# Patient Record
Sex: Male | Born: 1980 | Race: White | Hispanic: No | Marital: Married | Smoking: Former smoker
Health system: Southern US, Community
[De-identification: ages and names within clinical notes are randomized; demographics above are authoritative.]

## PROBLEM LIST (undated history)

## (undated) DIAGNOSIS — M1990 Unspecified osteoarthritis, unspecified site: Secondary | ICD-10-CM

## (undated) DIAGNOSIS — R569 Unspecified convulsions: Secondary | ICD-10-CM

## (undated) HISTORY — PX: VIDEO ASSISTED THORACOSCOPY (VATS)/DECORTICATION: SHX6171

---

## 2016-11-28 ENCOUNTER — Inpatient Hospital Stay (HOSPITAL_COMMUNITY)
Admission: AD | Admit: 2016-11-28 | Discharge: 2016-12-06 | DRG: 165 | Disposition: A | Discharge status: Home / Self Care | Payer: BLUE CROSS/BLUE SHIELD | Source: Other Acute Inpatient Hospital | Attending: Internal Medicine | Admitting: Internal Medicine

## 2016-11-28 DIAGNOSIS — K5903 Drug induced constipation: Secondary | ICD-10-CM | POA: Diagnosis not present

## 2016-11-28 DIAGNOSIS — K5900 Constipation, unspecified: Secondary | ICD-10-CM | POA: Diagnosis present

## 2016-11-28 DIAGNOSIS — T402X5A Adverse effect of other opioids, initial encounter: Secondary | ICD-10-CM | POA: Diagnosis not present

## 2016-11-28 DIAGNOSIS — Z836 Family history of other diseases of the respiratory system: Secondary | ICD-10-CM | POA: Diagnosis not present

## 2016-11-28 DIAGNOSIS — J439 Emphysema, unspecified: Secondary | ICD-10-CM | POA: Diagnosis present

## 2016-11-28 DIAGNOSIS — J9383 Other pneumothorax: Principal | ICD-10-CM | POA: Diagnosis present

## 2016-11-28 DIAGNOSIS — J9382 Other air leak: Secondary | ICD-10-CM | POA: Diagnosis not present

## 2016-11-28 DIAGNOSIS — G40909 Epilepsy, unspecified, not intractable, without status epilepticus: Secondary | ICD-10-CM | POA: Diagnosis present

## 2016-11-28 DIAGNOSIS — R0781 Pleurodynia: Secondary | ICD-10-CM | POA: Diagnosis not present

## 2016-11-28 DIAGNOSIS — R0602 Shortness of breath: Secondary | ICD-10-CM

## 2016-11-28 DIAGNOSIS — F17290 Nicotine dependence, other tobacco product, uncomplicated: Secondary | ICD-10-CM | POA: Diagnosis present

## 2016-11-28 DIAGNOSIS — Z9889 Other specified postprocedural states: Secondary | ICD-10-CM

## 2016-11-28 DIAGNOSIS — Z8669 Personal history of other diseases of the nervous system and sense organs: Secondary | ICD-10-CM

## 2016-11-28 DIAGNOSIS — M1990 Unspecified osteoarthritis, unspecified site: Secondary | ICD-10-CM | POA: Diagnosis present

## 2016-11-28 DIAGNOSIS — J930 Spontaneous tension pneumothorax: Secondary | ICD-10-CM | POA: Diagnosis not present

## 2016-11-28 DIAGNOSIS — R739 Hyperglycemia, unspecified: Secondary | ICD-10-CM | POA: Diagnosis not present

## 2016-11-28 DIAGNOSIS — J939 Pneumothorax, unspecified: Secondary | ICD-10-CM

## 2016-11-28 DIAGNOSIS — J9311 Primary spontaneous pneumothorax: Secondary | ICD-10-CM | POA: Diagnosis not present

## 2016-11-28 DIAGNOSIS — F17201 Nicotine dependence, unspecified, in remission: Secondary | ICD-10-CM

## 2016-11-28 HISTORY — DX: Unspecified osteoarthritis, unspecified site: M19.90

## 2016-11-28 HISTORY — DX: Unspecified convulsions: R56.9

## 2016-11-28 MED ORDER — HYDROMORPHONE HCL 1 MG/ML IJ SOLN
0.5000 mg | Freq: Once | INTRAMUSCULAR | Status: AC
  Administered 2016-11-28: 0.5 mg via INTRAVENOUS
  Filled 2016-11-28: qty 1

## 2016-11-28 MED ORDER — HYDROMORPHONE HCL 1 MG/ML IJ SOLN
1.0000 mg | INTRAMUSCULAR | Status: DC | PRN
  Administered 2016-11-28 – 2016-12-02 (×19): 1 mg via INTRAVENOUS
  Administered 2016-12-03: 0.5 mg via INTRAVENOUS
  Administered 2016-12-03: 1 mg via INTRAVENOUS
  Administered 2016-12-03: 0.5 mg via INTRAVENOUS
  Filled 2016-11-28 (×21): qty 1

## 2016-11-28 MED ORDER — KETOROLAC TROMETHAMINE 30 MG/ML IJ SOLN
30.0000 mg | Freq: Four times a day (QID) | INTRAMUSCULAR | Status: AC
  Administered 2016-11-28 – 2016-11-29 (×3): 30 mg via INTRAVENOUS
  Filled 2016-11-28 (×3): qty 1

## 2016-11-28 NOTE — H&P (Signed)
History and Physical    Corey Wilson ZOX:[id number] DOB: 03/10/1981 DOA: 11/28/2016  PCP: No primary care provider on file.   Patient coming from: [facility]  Chief Complaint: Shortness of breath.  HPI: Corey Wilson is a 36 y.o. gentleman with a history of seizure and recurrent spontaneous pneumothorax (S/P prior VATS on the right at [facility] in 2016) who presented to the ED at [facility] for evaluation of shortness of breath and left-sided pleuritic chest pain.  No fever.  No syncope.  No cough.  No swelling.  Chest xray showed large left sided pneumothorax.  Chest tube was placed emergently.  Case was discussed with CT surgeon on call here at [facility] (Dr. Tyrone Sage).  Hospitalist asked to admit for CT surgery consultation.   Review of Systems: As per HPI otherwise 10 point review of systems negative.    Past Medical History:  Diagnosis Date  . Arthritis   . Seizure [facility]     Past Surgical History:  Procedure Laterality Date  . VIDEO ASSISTED THORACOSCOPY (VATS)/DECORTICATION Right      reports that he has quit smoking. He has never used smokeless tobacco. He reports that he uses drugs, including Marijuana. He reports that he does not drink alcohol. He is married.  He has three children.  He is a Curator.  NKDA  Family History  Problem Relation Age of Onset  . Other Mother   . Emphysema Father   His mother has also had spontaneous pneumonthorax.   Prior to Admission medications   Not on File    Physical Exam: Vitals:   11/28/16 2204  BP: 129/73  Pulse: 84  Resp: 18  Temp: 98.8 F (37.1 C)  TempSrc: Oral  SpO2: 99%  Weight: 71.8 kg (158 lb 6.4 oz)  Height: 6\' 1"  (1.854 m)      Constitutional: NAD, calm, comfortable Vitals:   11/28/16 2204  BP: 129/73  Pulse: 84  Resp: 18  Temp: 98.8 F (37.1 C)  TempSrc: Oral  SpO2: 99%  Weight: 71.8 kg (158 lb 6.4 oz)  Height: 6\' 1"  (1.854 m)   Eyes: PERRL, lids and conjunctivae normal ENMT: Mucous membranes  are moist. Posterior pharynx clear of any exudate or lesions. Normal dentition.  Neck: normal appearance, supple Respiratory: clear to auscultation bilaterally, no wheezing, Bibasilar crackles.  Normal respiratory effort. No accessory muscle use.  Chest tube in place to anterior left chest wall. Cardiovascular: Normal rate, regular rhythm, no murmurs / rubs / gallops. No extremity edema. 2+ pedal pulses. GI: abdomen is soft and compressible.  No distention.  No tenderness.  No masses palpated.  Bowel sounds are present. Musculoskeletal:  No joint deformity in upper and lower extremities. Good ROM, no contractures. Normal muscle tone.  Skin: no rashes, warm and dry Neurologic: CN 2-12 grossly intact. Sensation intact, Strength symmetric bilaterally, 5/5.  Psychiatric: Normal judgment and insight. Alert and oriented x 3. Normal mood.     Labs on Admission: CBC and CMP at [facility] were unremarkable.  Patient also had a negative flu screen.   Assessment/Plan Principal Problem:   Spontaneous pneumothorax      Spontaneous pneumothorax --S/P chest tube at outside hospital, leave at current suction for now --CT surgery should see in the AM --Repeat portable chest xray in the AM and prn for changes in respiratory status --Analgesics as needed --Supplemental oxygen as needed --incentive spirometer use   DVT prophylaxis: SCDs Code Status: FULL Family Communication: Patient alone at time of admission. Disposition Plan:  To be determined. Consults called: CT surgery Tyrone Sage(Gerhardt) agreed to consult when transfer was requested.  He will need to be notified of patient arrival in the AM. Admission status: Inpatient, telemetry.  I expect this patient will need inpatient services for greater than two midnights.   TIME SPENT: 50 minutes   Jerene Bearsarter,Keanna Tugwell Harrison MD [facility] Pager [phone]  If 7PM-7AM, please contact night-coverage [email or web address] Password [email or web address]  11/28/2016, 11:47  PM

## 2016-11-29 ENCOUNTER — Inpatient Hospital Stay (HOSPITAL_COMMUNITY): Payer: BLUE CROSS/BLUE SHIELD

## 2016-11-29 ENCOUNTER — Encounter (HOSPITAL_COMMUNITY): Payer: Self-pay | Admitting: Internal Medicine

## 2016-11-29 DIAGNOSIS — J9311 Primary spontaneous pneumothorax: Secondary | ICD-10-CM

## 2016-11-29 DIAGNOSIS — J9383 Other pneumothorax: Secondary | ICD-10-CM

## 2016-11-29 LAB — PROTIME-INR
INR: 1.1
Prothrombin Time: 14.3 seconds (ref 11.4–15.2)

## 2016-11-29 MED ORDER — OXYCODONE HCL 5 MG PO TABS
5.0000 mg | ORAL_TABLET | ORAL | Status: DC | PRN
  Administered 2016-11-29 – 2016-12-03 (×15): 5 mg via ORAL
  Filled 2016-11-29 (×16): qty 1

## 2016-11-29 MED ORDER — ACETAMINOPHEN 650 MG RE SUPP: 650.0000 mg | Freq: Four times a day (QID) | RECTAL | Status: DC | PRN

## 2016-11-29 MED ORDER — SODIUM CHLORIDE 0.9% FLUSH
3.0000 mL | Freq: Two times a day (BID) | INTRAVENOUS | Status: DC
  Administered 2016-11-29 – 2016-12-02 (×5): 3 mL via INTRAVENOUS

## 2016-11-29 MED ORDER — ACETAMINOPHEN 325 MG PO TABS
650.0000 mg | ORAL_TABLET | Freq: Four times a day (QID) | ORAL | Status: DC | PRN
Start: 2016-11-29 — End: 2016-12-03
  Administered 2016-11-29: 650 mg via ORAL
  Filled 2016-11-29: qty 2

## 2016-11-29 MED ORDER — ONDANSETRON HCL 4 MG/2ML IJ SOLN
4.0000 mg | Freq: Four times a day (QID) | INTRAMUSCULAR | Status: DC | PRN
  Administered 2016-11-29: 4 mg via INTRAVENOUS
  Filled 2016-11-29: qty 2

## 2016-11-29 MED ORDER — ONDANSETRON HCL 4 MG PO TABS: 4.0000 mg | ORAL_TABLET | Freq: Four times a day (QID) | ORAL | Status: DC | PRN

## 2016-11-29 MED ORDER — SODIUM CHLORIDE 0.9 % IV SOLN
INTRAVENOUS | Status: DC
  Administered 2016-11-29 – 2016-12-01 (×3): via INTRAVENOUS

## 2016-11-29 NOTE — Progress Notes (Signed)
[address]       [place] [id number]             [phone]        Regino SchultzeChauncy Robinette  Medical Record #[id number] Date of Birth: 08/16/1981  Referring: Emergency room at [facility] Primary Care: No primary care provider on file.  Chief Complaint:   Left spontaneous pneumothorax   History of Present Illness:     Patient is 36 year old male with previous history of pneumothorax on the right 2, 2 years ago he underwent [facility]. Patient previously was a smoker but has not smoked for several years. 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 [facility] night with significant shortness of breath and was found to have a large left pneumothorax. Chest tube was placed and the patient was transferred to [place].   Current Activity/ Functional Status: Patient is independent with mobility/ambulation, transfers, ADL's, IADL's.   Zubrod Score: At the time of surgery this patient's most appropriate activity status/level should be described as: [x]     0    Normal activity, no symptoms []     1    Restricted in physical strenuous activity but ambulatory, able to do out light work []     2    Ambulatory and capable of self care, unable to do work activities, up and about                 more than 50%  Of the time                            []     3    Only limited self care, in bed greater than 50% of waking hours []     4    Completely disabled, no self care, confined to bed or chair []     5    Moribund  Past Medical History:  Diagnosis Date  . Arthritis   . Seizure [facility](HCC)     Past Surgical History:  Procedure Laterality Date  . VIDEO ASSISTED THORACOSCOPY (VATS)/DECORTICATION Right     History  Smoking Status  . Former Smoker  Smokeless Tobacco  . Never Used    History  Alcohol Use No   Family history: Patient's mother has  a history of spontaneous pneumothorax when she was young  Social History   Social History  . Marital status: N/A    Spouse name: N/A  . Number of children: N/A  . Years of education: N/A   Occupational History  . 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 Smoking status: Former Games developermoker  . Smokeless tobacco: Never Used  . Alcohol use No  . Drug use: Yes    Types: Marijuana  . Sexual activity: Not on file   Other Topics Concern  . Not on file   Social History Narrative  . No narrative on file    Allergies not on file  Current Facility-Administered Medications  Medication Dose Route Frequency Provider Last Rate Last Dose  . 0.9 %  sodium chloride infusion   Intravenous Continuous Zannie CovePreetha Joseph, MD 75 mL/hr at 11/29/16 0848    . acetaminophen (TYLENOL) tablet 650 mg  650 mg Oral Q6H PRN Michael LitterNikki Carter, MD       Or  .  acetaminophen (TYLENOL) suppository 650 mg  650 mg Rectal Q6H PRN Michael Litter, MD      . HYDROmorphone (DILAUDID) injection 1 mg  1 mg Intravenous Q3H PRN Michael Litter, MD   1 mg at 11/29/16 0849  . ketorolac (TORADOL) 30 MG/ML injection 30 mg  30 mg Intravenous Q6H Michael Litter, MD   30 mg at 11/29/16 0511  . ondansetron (ZOFRAN) tablet 4 mg  4 mg Oral Q6H PRN Michael Litter, MD       Or  . ondansetron [facility]) injection 4 mg  4 mg Intravenous Q6H PRN Michael Litter, MD   4 mg at 11/29/16 0907  . oxyCODONE (Oxy IR/ROXICODONE) immediate release tablet 5 mg  5 mg Oral Q4H PRN Michael Litter, MD      . sodium chloride flush (NS) 0.9 % injection 3 mL  3 mL Intravenous Q12H Michael Litter, MD   3 mL at 11/29/16 0849    No prescriptions prior to admission.    Family History  Problem Relation Age of Onset  . Other Mother   . Emphysema Father      Review of Systems:      Cardiac Review of Systems: Y or N  Chest Pain [ y   ]  Resting SOB [   ] Exertional SOB  [  y]  Orthopnea [  ]   Pedal Edema [   ]    Palpitations [  ] Syncope  [  ]     Presyncope [   ]  General Review of Systems: [Y] = yes [  ]=no Constitional: recent weight change [  ]; anorexia [  ]; fatigue [  ]; nausea [  ]; night sweats [  ]; fever [  ]; or chills [  ]                                                               Dental: poor dentition[  ]; Last Dentist visit:   Eye : blurred vision [  ]; diplopia [   ]; vision changes [  ];  Amaurosis fugax[  ]; Resp: cough [  ];  wheezing[n  ];  hemoptysis[n  ]; shortness of breath[  ]; paroxysmal nocturnal dyspnea[  ]; dyspnea on exertion[  ]; or orthopnea[  ];  GI:  gallstones[  ], vomiting[  ];  dysphagia[  ]; melena[  ];  hematochezia [  ]; heartburn[  ];   Hx of  Colonoscopy[  ]; GU: kidney stones [  ]; hematuria[  ];   dysuria [  ];  nocturia[  ];  history of     obstruction [  ]; urinary frequency [n  ]             Skin: rash, swelling[  ];, hair loss[  ];  peripheral edema[  ];  or itching[  ]; Musculosketetal: myalgias[  ];  joint swelling[  ];  joint erythema[  ];  joint pain[  ];  back pain[  ];  Heme/Lymph: bruising[  ];  bleeding[  ];  anemia[  ];  Neuro: TIA[  ];  headaches[  ];  stroke[  ];  vertigo[  ];  seizures[  ];   paresthesias[  ];  difficulty walking[ n ];  Psych:depression[  ]; anxiety[  ];  Endocrine: diabetes[ n ];  thyroid dysfunction[ n ];  Immunizations: Flu [ n ]; Pneumococcal[  n];  Other:  Physical Exam: BP 98/63 (BP Location: Left Arm)   Pulse 68   Temp 98 F (36.7 C) (Oral)   Resp 18   Ht 6\' 1"  (1.854 m)   Wt 158 lb 6.4 oz (71.8 kg)   SpO2 99%   BMI 20.90 kg/m    General appearance: alert, cooperative and appears stated age Head: Normocephalic, without obvious abnormality, atraumatic Neck: no adenopathy, no carotid bruit, no JVD, supple, symmetrical, trachea midline and thyroid not enlarged, symmetric, no tenderness/mass/nodules Lymph nodes: Cervical, supraclavicular, and axillary nodes normal. Resp: clear to auscultation bilaterally Back: symmetric, no curvature. ROM  normal. No CVA tenderness. Cardio: regular rate and rhythm, S1, S2 normal, no murmur, click, rub or gallop GI: soft, non-tender; bowel sounds normal; no masses,  no organomegaly Extremities: extremities normal, atraumatic, no cyanosis or edema and Homans sign is negative, no sign of DVT Neurologic: Grossly normal  Diagnostic Studies & Laboratory data:     Recent Radiology Findings:   Dg Chest Port 1 View  Result Date: 11/29/2016 CLINICAL DATA:  Pneumothorax.  Chest tube EXAM: PORTABLE CHEST 1 VIEW COMPARISON:  11/28/2016 FINDINGS: Left chest tube in satisfactory position. Minimal left apical pneumothorax. No effusion. Lungs are well aerated without infiltrate. Surgical staples in the right lung apex. IMPRESSION: Left chest tube in good position. Minimal left apical pneumothorax. No pleural effusion. Electronically Signed   By: Marlan Palau M.D.   On: 11/29/2016 08:37     I have independently reviewed the above radiologic studies.  Recent Lab Findings: Lab Results  Component Value Date   INR 1.10 11/28/2016      Assessment / Plan:    Patient with first episode of left pneumothorax, previous history of pneumothorax 2 on the right. Currently with a well-placed chest tube without evidence of pneumothorax or air leak. I discussed with the patient treatment options, with no current airleak he may be able to be treated at this occurrence with tube thoracoscopy only, but he understands there is a risk for repeat pneumothorax as happened on the right.  Chest tube was placed to waterseal follow-up chest x-ray will be obtained tomorrow.   I  spent 30 minutes counseling the patient face to face and 50% or more the  time was spent in counseling and coordination of care. The total time spent in the appointment was 55 minutes.    Delight Ovens MD      [address] [place] [place] [address] Office [phone]   Beeper [phone]  11/29/2016 10:56 AM     Patient ID:  Bryan Goin, male   DOB: Feb 25, 1981, 36 y.o.   MRN: [id number]

## 2016-11-29 NOTE — Progress Notes (Signed)
Patient using electric cigarette in room.

## 2016-11-29 NOTE — Progress Notes (Signed)
PROGRESS NOTE    Corey Wilson  ZOX:[id number] DOB: Oct 31, 1981 DOA: 11/28/2016 PCP: No primary care provider on file.  Brief Narrative: Patient is 36 year old male with previous history of pneumothorax on the right 2, 2 years ago he underwent right video-assisted thoracoscopy at [facility]. Patient previously was a smoker but has not smoked for several years. 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 He presented to the [facility] emergency room last night with significant shortness of breath and was found to have a large left pneumothorax. Chest tube was placed and the patient was transferred to [place] for CVTS eval  Assessment & Plan:   Principal Problem:   Spontaneous pneumothorax -s/p chest tube yesterday -CXR today -Await CVTS input  DVT prophylaxis: SCDs Code Status: FULL Family Communication: none at bedside Disposition Plan:Home when PTX resolves  Consultants:   CVTS   Subjective: Feels ok, some pain at CT site Objective: Vitals:   11/28/16 2204 11/29/16 0508  BP: 129/73 98/63  Pulse: 84 68  Resp: 18 18  Temp: 98.8 F (37.1 C) 98 F (36.7 C)  TempSrc: Oral Oral  SpO2: 99% 99%  Weight: 71.8 kg (158 lb 6.4 oz)   Height: 6\' 1"  (1.854 m)     Intake/Output Summary (Last 24 hours) at 11/29/16 1150 Last data filed at 11/29/16 0849  Gross per 24 hour  Intake             1203 ml  Output              450 ml  Net              753 ml   Filed Weights   11/28/16 2204  Weight: 71.8 kg (158 lb 6.4 oz)    Examination:  General exam: Appears calm and comfortable  Respiratory system: Clear to auscultation. Respiratory effort normal. Chest tube noted Cardiovascular system: S1 & S2 heard, RRR. No JVD, murmurs, rubs, gallops or clicks. No pedal edema. Gastrointestinal system: Abdomen is nondistended, soft and nontender. Normal bowel sounds heard. Central nervous system: Alert and oriented. No focal neurological  deficits. Extremities: Symmetric 5 x 5 power. Skin: No rashes, lesions or ulcers Psychiatry: Judgement and insight appear normal. Mood & affect appropriate.     Data Reviewed: I have personally reviewed following labs and imaging studies  CBC: No results for input(s): WBC, NEUTROABS, HGB, HCT, MCV, PLT in the last 168 hours. Basic Metabolic Panel: No results for input(s): NA, K, CL, CO2, GLUCOSE, BUN, CREATININE, CALCIUM, MG, PHOS in the last 168 hours. GFR: CrCl cannot be calculated (No order found.). Liver Function Tests: No results for input(s): AST, ALT, ALKPHOS, BILITOT, PROT, ALBUMIN in the last 168 hours. No results for input(s): LIPASE, AMYLASE in the last 168 hours. No results for input(s): AMMONIA in the last 168 hours. Coagulation Profile:  Recent Labs Lab 11/28/16 2348  INR 1.10   Cardiac Enzymes: No results for input(s): CKTOTAL, CKMB, CKMBINDEX, TROPONINI in the last 168 hours. BNP (last 3 results) No results for input(s): PROBNP in the last 8760 hours. HbA1C: No results for input(s): HGBA1C in the last 72 hours. CBG: No results for input(s): GLUCAP in the last 168 hours. Lipid Profile: No results for input(s): CHOL, HDL, LDLCALC, TRIG, CHOLHDL, LDLDIRECT in the last 72 hours. Thyroid Function Tests: No results for input(s): TSH, T4TOTAL, FREET4, T3FREE, THYROIDAB in the last 72 hours. Anemia Panel: No results for input(s): VITAMINB12, FOLATE, FERRITIN, TIBC,  IRON, RETICCTPCT in the last 72 hours. Urine analysis: No results found for: COLORURINE, APPEARANCEUR, LABSPEC, PHURINE, GLUCOSEU, HGBUR, BILIRUBINUR, KETONESUR, PROTEINUR, UROBILINOGEN, NITRITE, LEUKOCYTESUR Sepsis Labs: @LABRCNTIP (procalcitonin:4,lacticidven:4)  )No results found for this or any previous visit (from the past 240 hour(s)).       Radiology Studies: Dg Chest Port 1 View  Result Date: 11/29/2016 CLINICAL DATA:  Pneumothorax.  Chest tube EXAM: PORTABLE CHEST 1 VIEW COMPARISON:   11/28/2016 FINDINGS: Left chest tube in satisfactory position. Minimal left apical pneumothorax. No effusion. Lungs are well aerated without infiltrate. Surgical staples in the right lung apex. IMPRESSION: Left chest tube in good position. Minimal left apical pneumothorax. No pleural effusion. Electronically Signed   By: Marlan Palauharles  Clark M.D.   On: 11/29/2016 08:37        Scheduled Meds: . ketorolac  30 mg Intravenous Q6H  . sodium chloride flush  3 mL Intravenous Q12H   Continuous Infusions: . sodium chloride 75 mL/hr at 11/29/16 0848     LOS: 1 day    Time spent: 35min    Zannie CovePreetha Corbet Hanley, MD [facility] Pager [phone]  If 7PM-7AM, please contact night-coverage [email or web address] Password [email or web address] 11/29/2016, 11:50 AM

## 2016-11-30 ENCOUNTER — Inpatient Hospital Stay (HOSPITAL_COMMUNITY): Payer: BLUE CROSS/BLUE SHIELD

## 2016-11-30 MED ORDER — KETOROLAC TROMETHAMINE 30 MG/ML IJ SOLN
30.0000 mg | Freq: Once | INTRAMUSCULAR | Status: AC
  Administered 2016-11-30: 30 mg via INTRAVENOUS
  Filled 2016-11-30: qty 1

## 2016-11-30 MED ORDER — POLYETHYLENE GLYCOL 3350 17 G PO PACK
17.0000 g | PACK | Freq: Every day | ORAL | Status: DC | PRN
  Administered 2016-11-30: 17 g via ORAL
  Filled 2016-11-30: qty 1

## 2016-11-30 NOTE — Progress Notes (Addendum)
      [address]       [place] [id number]             [phone]        Subjective:  Pain at chest tube site.  States medication isn't lasting long enough.  Objective: Vital signs in last 24 hours: Temp:  [98 F (36.7 C)-99.2 F (37.3 C)] 98.9 F (37.2 C) (01/29 0503) Pulse Rate:  [66-87] 87 (01/29 0503) Cardiac Rhythm: Normal sinus rhythm (01/29 0701) Resp:  [18] 18 (01/29 0503) BP: (109-131)/(58-76) 131/76 (01/29 0503) SpO2:  [100 %] 100 % (01/29 0503)  Intake/Output from previous day: 01/28 0701 - 01/29 0700 In: 1203 [P.O.:1200; I.V.:3] Out: 3855 [Urine:3825; Chest Tube:30]  General appearance: alert, cooperative and no distress Heart: regular rate and rhythm Lungs: clear to auscultation bilaterally Abdomen: soft, non-tender; bowel sounds normal; no masses,  no organomegaly Wound: clean and dry  Lab Results: No results for input(s): WBC, HGB, HCT, PLT in the last 72 hours. BMET: No results for input(s): NA, K, CL, CO2, GLUCOSE, BUN, CREATININE, CALCIUM in the last 72 hours.  PT/INR:  Recent Labs  11/28/16 2348  LABPROT 14.3  INR 1.10   ABG No results found for: PHART, HCO3, TCO2, ACIDBASEDEF, O2SAT CBG (last 3)  No results for input(s): GLUCAP in the last 72 hours.  Assessment/Plan:  1. Chest tube- +air leak with cough, CXR with 5% pneumothorax- leave on water seal today 2. Pain control- will give 30 mg IV toradol, continue Oxy, Dilaudid 3. Dispo- care per primary, try toradol for pain will check BMET to ensure creatinine is WNL, repeat CXR in AM   LOS: 2 days    BARRETT, ERIN 11/30/2016  No air leak currently , chest tube in, patient considering if he wants to have left VATS or d/c chest tube and follow  I have seen and examined Yehuda Savannahhauncy Fratus and agree with the above assessment  and plan.  Delight OvensEdward B Azarion Hove MD Beeper [phone] Office [phone] 11/30/2016 5:24 PM

## 2016-11-30 NOTE — Progress Notes (Signed)
PROGRESS NOTE    Corey Wilson  NFA:[id number] DOB: 10/16/1981 DOA: 11/28/2016 PCP: No primary care provider on file.  Brief Narrative: Patient is 36 year old male with previous history of pneumothorax on the right 2, 2 years ago he underwent right video-assisted thoracoscopy at [facility]. Patient previously was a smoker but has not smoked for several years. 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 He presented to the [facility] emergency room last night with significant shortness of breath and was found to have a large left pneumothorax. Chest tube was placed and the patient was transferred to [place] for CVTS eval  Assessment & Plan:   Principal Problem:   Spontaneous pneumothorax -s/p chest tube 1/27 at [facility] ER -CXR with 5% of less apical PTX -plan per CVTS  DVT prophylaxis: SCDs Code Status: FULL Family Communication: none at bedside Disposition Plan:Home when PTX resolves  Consultants:   CVTS   Subjective: Feels ok except for discomfort at chest tube site and pleuritic pain  Objective: Vitals:   11/29/16 0508 11/29/16 1414 11/29/16 2138 11/30/16 0503  BP: 98/63 (!) 109/58 120/69 131/76  Pulse: 68 66 86 87  Resp: 18 18 18 18   Temp: 98 F (36.7 C) 98 F (36.7 C) 99.2 F (37.3 C) 98.9 F (37.2 C)  TempSrc: Oral Oral Oral Oral  SpO2: 99% 100% 100% 100%  Weight:      Height:        Intake/Output Summary (Last 24 hours) at 11/30/16 1101 Last data filed at 11/30/16 0600  Gross per 24 hour  Intake              720 ml  Output             3555 ml  Net            -2835 ml   Filed Weights   11/28/16 2204  Weight: 71.8 kg (158 lb 6.4 oz)    Examination:  General exam: Appears calm and comfortable  Respiratory system: decreased BS on left Cardiovascular system: S1 & S2 heard, RRR. No JVD, murmurs Gastrointestinal system: Abdomen is nondistended, soft and nontender. Normal bowel sounds heard. Central nervous  system: Alert and oriented. No focal neurological deficits. Extremities: Symmetric 5 x 5 power. Skin: No rashes, lesions or ulcers Psychiatry: Judgement and insight appear normal. Mood & affect appropriate.     Data Reviewed: I have personally reviewed following labs and imaging studies  CBC: No results for input(s): WBC, NEUTROABS, HGB, HCT, MCV, PLT in the last 168 hours. Basic Metabolic Panel: No results for input(s): NA, K, CL, CO2, GLUCOSE, BUN, CREATININE, CALCIUM, MG, PHOS in the last 168 hours. GFR: CrCl cannot be calculated (No order found.). Liver Function Tests: No results for input(s): AST, ALT, ALKPHOS, BILITOT, PROT, ALBUMIN in the last 168 hours. No results for input(s): LIPASE, AMYLASE in the last 168 hours. No results for input(s): AMMONIA in the last 168 hours. Coagulation Profile:  Recent Labs Lab 11/28/16 2348  INR 1.10   Cardiac Enzymes: No results for input(s): CKTOTAL, CKMB, CKMBINDEX, TROPONINI in the last 168 hours. BNP (last 3 results) No results for input(s): PROBNP in the last 8760 hours. HbA1C: No results for input(s): HGBA1C in the last 72 hours. CBG: No results for input(s): GLUCAP in the last 168 hours. Lipid Profile: No results for input(s): CHOL, HDL, LDLCALC, TRIG, CHOLHDL, LDLDIRECT in the last 72 hours. Thyroid Function Tests: No results for input(s): TSH,  T4TOTAL, FREET4, T3FREE, THYROIDAB in the last 72 hours. Anemia Panel: No results for input(s): VITAMINB12, FOLATE, FERRITIN, TIBC, IRON, RETICCTPCT in the last 72 hours. Urine analysis: No results found for: COLORURINE, APPEARANCEUR, LABSPEC, PHURINE, GLUCOSEU, HGBUR, BILIRUBINUR, KETONESUR, PROTEINUR, UROBILINOGEN, NITRITE, LEUKOCYTESUR Sepsis Labs: @LABRCNTIP (procalcitonin:4,lacticidven:4)  )No results found for this or any previous visit (from the past 240 hour(s)).       Radiology Studies: Dg Chest 2 View  Result Date: 11/30/2016 CLINICAL DATA:  Follow-up left-sided  pneumothorax with chest tube treatment. EXAM: CHEST  2 VIEW COMPARISON:  Portable chest x-ray of November 29, 2016 FINDINGS: The left apical pneumothorax persists and amounts to 5% or less of the lung volume. The left-sided chest tube tip projects over the posterior interspace of the fourth and fifth left ribs. This is slightly lower than on the previous study. There is no mediastinal shift. There is no pleural effusion. The right lung is clear. The heart and pulmonary vascularity are normal. IMPRESSION: 5% or less left apical pneumothorax which appears stable. Slightly inferiorly positioned left chest tube today as compared to yesterday's study. Electronically Signed   By: David  SwazilandJordan M.D.   On: 11/30/2016 07:12   Dg Chest Port 1 View  Result Date: 11/29/2016 CLINICAL DATA:  Pneumothorax.  Chest tube EXAM: PORTABLE CHEST 1 VIEW COMPARISON:  11/28/2016 FINDINGS: Left chest tube in satisfactory position. Minimal left apical pneumothorax. No effusion. Lungs are well aerated without infiltrate. Surgical staples in the right lung apex. IMPRESSION: Left chest tube in good position. Minimal left apical pneumothorax. No pleural effusion. Electronically Signed   By: Marlan Palauharles  Clark M.D.   On: 11/29/2016 08:37        Scheduled Meds: . sodium chloride flush  3 mL Intravenous Q12H   Continuous Infusions: . sodium chloride 75 mL/hr at 11/29/16 2308     LOS: 2 days    Time spent: 35min    Zannie CovePreetha Esaias Cleavenger, MD [facility] Pager [phone]  If 7PM-7AM, please contact night-coverage [email or web address] Password [email or web address] 11/30/2016, 11:01 AM

## 2016-12-01 ENCOUNTER — Inpatient Hospital Stay (HOSPITAL_COMMUNITY): Payer: BLUE CROSS/BLUE SHIELD

## 2016-12-01 LAB — BASIC METABOLIC PANEL
Anion gap: 8 (ref 5–15)
BUN: 12 mg/dL (ref 6–20)
CO2: 28 mmol/L (ref 22–32)
Calcium: 9.1 mg/dL (ref 8.9–10.3)
Chloride: 105 mmol/L (ref 101–111)
Creatinine, Ser: 0.93 mg/dL (ref 0.61–1.24)
GFR calc Af Amer: >60 mL/min (ref >60)
GLUCOSE: 126 mg/dL — AB (ref 65–99)
POTASSIUM: 3.7 mmol/L (ref 3.5–5.1)
Sodium: 141 mmol/L (ref 135–145)

## 2016-12-01 MED ORDER — KETOROLAC TROMETHAMINE 30 MG/ML IJ SOLN
30.0000 mg | Freq: Once | INTRAMUSCULAR | Status: AC
  Administered 2016-12-01: 30 mg via INTRAVENOUS
  Filled 2016-12-01: qty 1

## 2016-12-01 MED ORDER — POLYETHYLENE GLYCOL 3350 17 G PO PACK
17.0000 g | PACK | Freq: Two times a day (BID) | ORAL | Status: DC
  Administered 2016-12-01 – 2016-12-02 (×3): 17 g via ORAL
  Filled 2016-12-01 (×4): qty 1

## 2016-12-01 MED ORDER — HYDROMORPHONE HCL 1 MG/ML IJ SOLN
1.0000 mg | Freq: Once | INTRAMUSCULAR | Status: AC
  Administered 2016-12-01: 1 mg via INTRAVENOUS
  Filled 2016-12-01: qty 1

## 2016-12-01 MED ORDER — MAGNESIUM HYDROXIDE 400 MG/5ML PO SUSP
30.0000 mL | Freq: Every day | ORAL | Status: DC | PRN
  Administered 2016-12-02: 30 mL via ORAL
  Filled 2016-12-01: qty 30

## 2016-12-01 MED ORDER — SENNOSIDES 8.8 MG/5ML PO SYRP
5.0000 mL | ORAL_SOLUTION | Freq: Two times a day (BID) | ORAL | Status: DC
  Administered 2016-12-01 – 2016-12-02 (×3): 5 mL via ORAL
  Filled 2016-12-01 (×5): qty 5

## 2016-12-01 NOTE — Progress Notes (Signed)
PROGRESS NOTE    Corey Wilson  ZOX:[id number] DOB: 11/10/1980 DOA: 11/28/2016 PCP: No primary care provider on file.  Brief Narrative: Patient is 36 year old male with previous history of pneumothorax on the right 2, 2 years ago he underwent right [facility]ac[facility]. Patient previously was a smoker but has not smoked for several years. 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 [facility] night with significant shortness of breath and was found to have a large left pneumothorax. Chest tube was placed and the patient was transferred to [place] for CVTS eval. Dr.Gerhardt following  Assessment & Plan:    Spontaneous pneumothorax -s/p chest [facility]/27 [facility] -CXR with 5% of less apical PTX, still with air leak -plan per CVTS   Constipation -added laxatives  DVT prophylaxis: SCDs Code Status: FULL Family Communication: none at bedside Disposition Plan:Home when PTX resolves  Consultants:   CVTS   Subjective: Feels ok, c/o constipation  Objective: Vitals:   11/30/16 2051 11/30/16 2213 12/01/16 0611 12/01/16 0829  BP: 124/86  (!) 94/45 135/78  Pulse: 99  60 83  Resp: 18  18 18   Temp: 99.1 F (37.3 C) 99.1 F (37.3 C) 98.1 F (36.7 C) 98 F (36.7 C)  TempSrc: Oral Oral Oral Oral  SpO2: 99%  97% 99%  Weight:      Height:        Intake/Output Summary (Last 24 hours) at 12/01/16 1122 Last data filed at 12/01/16 1053  Gross per 24 hour  Intake              345 ml  Output             1340 ml  Net             -995 ml   Filed Weights   11/28/16 2204  Weight: 71.8 kg (158 lb 6.4 oz)    Examination:  General exam: Appears calm and comfortable  Respiratory system: decreased BS on left Cardiovascular system: S1 & S2 heard, RRR. No JVD, murmurs Gastrointestinal system: Abdomen is nondistended, soft and nontender. Normal bowel sounds heard. Central  nervous system: Alert and oriented. No focal neurological deficits. Extremities: Symmetric 5 x 5 power. Skin: No rashes, lesions or ulcers Psychiatry: Judgement and insight appear normal. Mood & affect appropriate.     Data Reviewed: I have personally reviewed following labs and imaging studies  CBC: No results for input(s): WBC, NEUTROABS, HGB, HCT, MCV, PLT in the last 168 hours. Basic Metabolic Panel:  Recent Labs Lab 12/01/16 0227  NA 141  K 3.7  CL 105  CO2 28  GLUCOSE 126*  BUN 12  CREATININE 0.93  CALCIUM 9.1   GFR: Estimated Creatinine Clearance: 113.8 mL/min (by C-G formula based on SCr of 0.93 mg/dL). Liver Function Tests: No results for input(s): AST, ALT, ALKPHOS, BILITOT, PROT, ALBUMIN in the last 168 hours. No results for input(s): LIPASE, AMYLASE in the last 168 hours. No results for input(s): AMMONIA in the last 168 hours. Coagulation Profile:  Recent Labs Lab 11/28/16 2348  INR 1.10   Cardiac Enzymes: No results for input(s): CKTOTAL, CKMB, CKMBINDEX, TROPONINI in the last 168 hours. BNP (last 3 results) No results for input(s): PROBNP in the last 8760 hours. HbA1C: No results for input(s): HGBA1C in the last 72 hours. CBG: No results for input(s): GLUCAP in the last 168 hours. Lipid Profile: No results  for input(s): CHOL, HDL, LDLCALC, TRIG, CHOLHDL, LDLDIRECT in the last 72 hours. Thyroid Function Tests: No results for input(s): TSH, T4TOTAL, FREET4, T3FREE, THYROIDAB in the last 72 hours. Anemia Panel: No results for input(s): VITAMINB12, FOLATE, FERRITIN, TIBC, IRON, RETICCTPCT in the last 72 hours. Urine analysis: No results found for: COLORURINE, APPEARANCEUR, LABSPEC, PHURINE, GLUCOSEU, HGBUR, BILIRUBINUR, KETONESUR, PROTEINUR, UROBILINOGEN, NITRITE, LEUKOCYTESUR Sepsis Labs: @LABRCNTIP (procalcitonin:4,lacticidven:4)  )No results found for this or any previous visit (from the past 240 hour(s)).       Radiology Studies: Dg Chest 2  View  Result Date: 11/30/2016 CLINICAL DATA:  Follow-up left-sided pneumothorax with chest tube treatment. EXAM: CHEST  2 VIEW COMPARISON:  Portable chest x-ray of November 29, 2016 FINDINGS: The left apical pneumothorax persists and amounts to 5% or less of the lung volume. The left-sided chest tube tip projects over the posterior interspace of the fourth and fifth left ribs. This is slightly lower than on the previous study. There is no mediastinal shift. There is no pleural effusion. The right lung is clear. The heart and pulmonary vascularity are normal. IMPRESSION: 5% or less left apical pneumothorax which appears stable. Slightly inferiorly positioned left chest tube today as compared to yesterday's study. Electronically Signed   By: David  [place] M.D.   On: 11/30/2016 07:12   Dg Chest Port 1 View  Result Date: 12/01/2016 CLINICAL DATA:  Left-sided chest tube.  Pneumothorax. EXAM: PORTABLE CHEST 1 VIEW COMPARISON:  Yesterday FINDINGS: Trace left apical pneumothorax, 5% or less. Left chest wall emphysema is decreased. Chest tube in place with slightly different orientation of the apex. Clear lungs. Normal heart size. IMPRESSION: Stable trace left apical pneumothorax. Electronically Signed   By: Marnee Spring M.D.   On: 12/01/2016 08:42        Scheduled Meds: . polyethylene glycol  17 g Oral BID  . sennosides  5 mL Oral BID  . sodium chloride flush  3 mL Intravenous Q12H   Continuous Infusions:    LOS: 3 days    Time spent:    Zannie Cove, MD [facility] Pager [phone]  If 7PM-7AM, please contact night-coverage [email or web address] Password [email or web address] 12/01/2016, 11:22 AM

## 2016-12-01 NOTE — Care Management Note (Signed)
Case Management Note Donn PieriniKristi Denajah Farias RN, BSN Unit 2W-Case Manager [phone]  Patient Details  Name: Yehuda SavannahChauncy Dziuba MRN: [id number] Date of Birth: 08/20/1981  Subjective/Objective:  Pt admitted with spont. pntx- chest tube placed                  Action/Plan: PTA pt lived at home- anticipate return home may need VATs prior to discharge. CM will follow.    Expected Discharge Date:                  Expected Discharge Plan:  Home/Self Care  In-House Referral:     Discharge planning Services  CM Consult  Post Acute Care Choice:    Choice offered to:     DME Arranged:    DME Agency:     HH Arranged:    HH Agency:     Status of Service:  In process, will continue to follow  If discussed at Long Length of Stay Meetings, dates discussed:    Additional Comments:  Darrold SpanWebster, Breck [place] Hall, RN 12/01/2016, 10:39 AM

## 2016-12-01 NOTE — Progress Notes (Addendum)
[address]       [place] [id number]             [phone]         Subjective: Feels fine  Objective: Vital signs in last 24 hours: Temp:  [98 F (36.7 C)-99.1 F (37.3 C)] 98.1 F (36.7 C) (01/30 0611) Pulse Rate:  [60-99] 60 (01/30 0611) Cardiac Rhythm: Normal sinus rhythm (01/29 1900) Resp:  [18] 18 (01/30 0611) BP: (94-131)/(45-86) 94/45 (01/30 0611) SpO2:  [97 %-99 %] 97 % (01/30 0611)  Hemodynamic parameters for last 24 hours:    Intake/Output from previous day: 01/29 0701 - 01/30 0700 In: -  Out: 960 [Urine:950; Chest Tube:10] Intake/Output this shift: No intake/output data recorded.  General appearance: alert, cooperative and no distress Heart: regular rate and rhythm Lungs: clear to auscultation bilaterally Abdomen: benign  Lab Results: No results for input(s): WBC, HGB, HCT, PLT in the last 72 hours. BMET:  Recent Labs  12/01/16 0227  NA 141  K 3.7  CL 105  CO2 28  GLUCOSE 126*  BUN 12  CREATININE 0.93  CALCIUM 9.1    PT/INR:  Recent Labs  11/28/16 2348  LABPROT 14.3  INR 1.10   ABG No results found for: PHART, HCO3, TCO2, ACIDBASEDEF, O2SAT CBG (last 3)  No results for input(s): GLUCAP in the last 72 hours.  Meds Scheduled Meds: . polyethylene glycol  17 g Oral BID  . sennosides  5 mL Oral BID  . sodium chloride flush  3 mL Intravenous Q12H   Continuous Infusions: PRN Meds:.acetaminophen **OR** acetaminophen, HYDROmorphone (DILAUDID) injection, ondansetron **OR** ondansetron (ZOFRAN) IV, oxyCODONE  Xrays Dg Chest 2 View  Result Date: 11/30/2016 CLINICAL DATA:  Follow-up left-sided pneumothorax with chest tube treatment. EXAM: CHEST  2 VIEW COMPARISON:  Portable chest x-ray of November 29, 2016 FINDINGS: The left apical pneumothorax persists and amounts to 5% or less of the lung volume. The left-sided chest tube tip projects over the posterior interspace of the fourth and fifth left ribs. This is slightly  lower than on the previous study. There is no mediastinal shift. There is no pleural effusion. The right lung is clear. The heart and pulmonary vascularity are normal. IMPRESSION: 5% or less left apical pneumothorax which appears stable. Slightly inferiorly positioned left chest tube today as compared to yesterday's study. Electronically Signed   By: David  Swazil[place]Jordan M.D.   On: 11/30/2016 07:12   Dg Chest Port 1 View  Result Date: 11/29/2016 CLINICAL DATA:  Pneumothorax.  Chest tube EXAM: PORTABLE CHEST 1 VIEW COMPARISON:  11/28/2016 FINDINGS: Left chest tube in satisfactory position. Minimal left apical pneumothorax. No effusion. Lungs are well aerated without infiltrate. Surgical staples in the right lung apex. IMPRESSION: Left chest tube in good position. Minimal left apical pneumothorax. No pleural effusion. Electronically Signed   By: Marlan Palauharles  Clark M.D.   On: 11/29/2016 08:37    Assessment/Plan:  1 CXR is stable but has + air leak, keep to H2O seal for now, may need VATS 2 c/o some constipation- will order MOM    LOS: 3 days    GOLD,WAYNE E 12/01/2016   Discussed with patient the fact today he has definite air leak with cough This defines the treatment course , to proceed with Left VATs and stapling blebs , plan Thursday  Am I have seen and examined Yehuda Savannahhauncy Stefan and agree with the above assessment  and plan.  Delight OvensEdward B Vivion Romano MD  Beeper [phone] Office [phone] 12/01/2016 8:00 PM

## 2016-12-02 DIAGNOSIS — R0602 Shortness of breath: Secondary | ICD-10-CM

## 2016-12-02 DIAGNOSIS — K5900 Constipation, unspecified: Secondary | ICD-10-CM

## 2016-12-02 DIAGNOSIS — K5903 Drug induced constipation: Secondary | ICD-10-CM

## 2016-12-02 DIAGNOSIS — Z8669 Personal history of other diseases of the nervous system and sense organs: Secondary | ICD-10-CM

## 2016-12-02 DIAGNOSIS — F17201 Nicotine dependence, unspecified, in remission: Secondary | ICD-10-CM

## 2016-12-02 DIAGNOSIS — J9311 Primary spontaneous pneumothorax: Secondary | ICD-10-CM

## 2016-12-02 DIAGNOSIS — J9383 Other pneumothorax: Principal | ICD-10-CM

## 2016-12-02 LAB — CBC
HCT: 48.8 % (ref 39.0–52.0)
HEMOGLOBIN: 16.8 g/dL (ref 13.0–17.0)
MCH: 29.7 pg (ref 26.0–34.0)
MCHC: 34.4 g/dL (ref 30.0–36.0)
MCV: 86.2 fL (ref 78.0–100.0)
PLATELETS: 242 10*3/uL (ref 150–400)
RBC: 5.66 MIL/uL (ref 4.22–5.81)
RDW: 12 % (ref 11.5–15.5)
WBC: 8.8 10*3/uL (ref 4.0–10.5)

## 2016-12-02 LAB — TYPE AND SCREEN
ABO/RH(D): O NEG
ANTIBODY SCREEN: NEGATIVE

## 2016-12-02 LAB — CBC WITH DIFFERENTIAL/PLATELET
BASOS ABS: 0 10*3/uL (ref 0.0–0.1)
BASOS ABS: 0 10*3/uL (ref 0.0–0.1)
BASOS PCT: 0 %
BASOS PCT: 0 %
Eosinophils Absolute: 0.3 10*3/uL (ref 0.0–0.7)
Eosinophils Absolute: 0.3 10*3/uL (ref 0.0–0.7)
Eosinophils Relative: 4 %
Eosinophils Relative: 3 %
HEMATOCRIT: 48 % (ref 39.0–52.0)
HEMATOCRIT: 48.2 % (ref 39.0–52.0)
HEMOGLOBIN: 16.5 g/dL (ref 13.0–17.0)
Hemoglobin: 16.1 g/dL (ref 13.0–17.0)
Lymphocytes Relative: 23 %
Lymphocytes Relative: 14 %
Lymphs Abs: 1.8 10*3/uL (ref 0.7–4.0)
Lymphs Abs: 1.3 10*3/uL (ref 0.7–4.0)
MCH: 29.1 pg (ref 26.0–34.0)
MCH: 29.4 pg (ref 26.0–34.0)
MCHC: 33.5 g/dL (ref 30.0–36.0)
MCHC: 34.2 g/dL (ref 30.0–36.0)
MCV: 86.6 fL (ref 78.0–100.0)
MCV: 85.9 fL (ref 78.0–100.0)
MONO ABS: 0.8 10*3/uL (ref 0.1–1.0)
Monocytes Absolute: 1 10*3/uL (ref 0.1–1.0)
Monocytes Relative: 10 %
Monocytes Relative: 11 %
NEUTROS ABS: 4.9 10*3/uL (ref 1.7–7.7)
NEUTROS ABS: 6.7 10*3/uL (ref 1.7–7.7)
NEUTROS PCT: 72 %
Neutrophils Relative %: 63 %
PLATELETS: 214 10*3/uL (ref 150–400)
Platelets: 241 10*3/uL (ref 150–400)
RBC: 5.54 MIL/uL (ref 4.22–5.81)
RBC: 5.61 MIL/uL (ref 4.22–5.81)
RDW: 12.4 % (ref 11.5–15.5)
RDW: 12 % (ref 11.5–15.5)
WBC: 7.9 10*3/uL (ref 4.0–10.5)
WBC: 9.2 10*3/uL (ref 4.0–10.5)

## 2016-12-02 LAB — BLOOD GAS, ARTERIAL
ACID-BASE EXCESS: 4.2 mmol/L — AB (ref 0.0–2.0)
BICARBONATE: 28.4 mmol/L — AB (ref 20.0–28.0)
DRAWN BY: 257701
FIO2: 0.21
O2 SAT: 95.9 %
PCO2 ART: 44.2 mmHg (ref 32.0–48.0)
PH ART: 7.424 (ref 7.350–7.450)
PO2 ART: 79.6 mmHg — AB (ref 83.0–108.0)
Patient temperature: 98.6

## 2016-12-02 LAB — PROTIME-INR
INR: 1.08
PROTHROMBIN TIME: 14 s (ref 11.4–15.2)

## 2016-12-02 LAB — COMPREHENSIVE METABOLIC PANEL
ALBUMIN: 3.9 g/dL (ref 3.5–5.0)
ALK PHOS: 66 U/L (ref 38–126)
ALK PHOS: 63 U/L (ref 38–126)
ALT: 15 U/L — ABNORMAL LOW (ref 17–63)
ALT: 14 U/L — ABNORMAL LOW (ref 17–63)
AST: 15 U/L (ref 15–41)
AST: 15 U/L (ref 15–41)
Albumin: 4.2 g/dL (ref 3.5–5.0)
Anion gap: 12 (ref 5–15)
Anion gap: 9 (ref 5–15)
BILIRUBIN TOTAL: 0.8 mg/dL (ref 0.3–1.2)
BUN: 14 mg/dL (ref 6–20)
BUN: 14 mg/dL (ref 6–20)
CALCIUM: 10.1 mg/dL (ref 8.9–10.3)
CALCIUM: 10.2 mg/dL (ref 8.9–10.3)
CHLORIDE: 101 mmol/L (ref 101–111)
CO2: 31 mmol/L (ref 22–32)
CO2: 31 mmol/L (ref 22–32)
Chloride: 99 mmol/L — ABNORMAL LOW (ref 101–111)
Creatinine, Ser: 1.04 mg/dL (ref 0.61–1.24)
Creatinine, Ser: 1.12 mg/dL (ref 0.61–1.24)
GFR calc Af Amer: >60 mL/min (ref >60)
GFR calc Af Amer: >60 mL/min (ref >60)
GFR calc non Af Amer: >60 mL/min (ref >60)
GFR calc non Af Amer: >60 mL/min (ref >60)
GLUCOSE: 87 mg/dL (ref 65–99)
GLUCOSE: 101 mg/dL — AB (ref 65–99)
Potassium: 4.4 mmol/L (ref 3.5–5.1)
Potassium: 4.9 mmol/L (ref 3.5–5.1)
SODIUM: 141 mmol/L (ref 135–145)
Sodium: 142 mmol/L (ref 135–145)
TOTAL PROTEIN: 7.2 g/dL (ref 6.5–8.1)
Total Bilirubin: 0.7 mg/dL (ref 0.3–1.2)
Total Protein: 7.5 g/dL (ref 6.5–8.1)

## 2016-12-02 LAB — ABO/RH: ABO/RH(D): O NEG

## 2016-12-02 LAB — APTT: aPTT: 35 seconds (ref 24–36)

## 2016-12-02 MED ORDER — OXYCODONE HCL 5 MG PO TABS
5.0000 mg | ORAL_TABLET | Freq: Once | ORAL | Status: AC
  Administered 2016-12-02: 5 mg via ORAL
  Filled 2016-12-02: qty 1

## 2016-12-02 MED ORDER — KETOROLAC TROMETHAMINE 30 MG/ML IJ SOLN
30.0000 mg | Freq: Once | INTRAMUSCULAR | Status: AC
  Administered 2016-12-02: 30 mg via INTRAVENOUS
  Filled 2016-12-02: qty 1

## 2016-12-02 MED ORDER — DEXTROSE 5 % IV SOLN
1.5000 g | INTRAVENOUS | Status: AC
  Administered 2016-12-03: 1.5 g via INTRAVENOUS
  Filled 2016-12-02: qty 1.5

## 2016-12-02 NOTE — Anesthesia Preprocedure Evaluation (Addendum)
Anesthesia Evaluation  Patient identified by MRN, date of birth, ID band Patient awake    Reviewed: Allergy & Precautions, NPO status , Patient's Chart, lab work & pertinent test results  Airway Mallampati: II  TM Distance: >3 FB Neck ROM: Full    Dental no notable dental hx. (+) Teeth Intact   Pulmonary shortness of breath, former smoker,  Hx of spontaneous pneumothoraces, now with persistent air leak    + decreased breath sounds(-) wheezing (-) stridor     Cardiovascular negative cardio ROS Normal cardiovascular exam Rhythm:Regular Rate:Normal     Neuro/Psych Seizures -,  negative psych ROS   GI/Hepatic negative GI ROS, Neg liver ROS,   Endo/Other  negative endocrine ROS  Renal/GU negative Renal ROS     Musculoskeletal  (+) Arthritis ,   Abdominal   Peds  Hematology negative hematology ROS (+)   Anesthesia Other Findings   Reproductive/Obstetrics                           Anesthesia Physical Anesthesia Plan  ASA: II  Anesthesia Plan: General   Post-op Pain Management:    Induction: Intravenous  Airway Management Planned: Double Lumen EBT  Additional Equipment: Arterial line  Intra-op Plan: Utilization of Controlled Hypotension per surrgeon request  Post-operative Plan: Extubation in OR  Informed Consent: I have reviewed the patients History and Physical, chart, labs and discussed the procedure including the risks, benefits and alternatives for the proposed anesthesia with the patient or authorized representative who has indicated his/her understanding and acceptance.   Dental advisory given  Plan Discussed with: CRNA  Anesthesia Plan Comments:        Anesthesia Quick Evaluation

## 2016-12-02 NOTE — Progress Notes (Addendum)
       [address]       [place] [address]             [phone]        Procedure(s) (LRB): VIDEO ASSISTED THORACOSCOPY (Left) Subjective: No issues this morning. Feels okay.   Objective: Vital signs in last 24 hours: Temp:  [98 F (36.7 C)-98.5 F (36.9 C)] 98.5 F (36.9 C) (01/31 0515) Pulse Rate:  [79-95] 95 (01/31 0515) Cardiac Rhythm: Normal sinus rhythm (01/31 0701) Resp:  [16-18] 18 (01/31 0515) BP: (112-135)/(73-79) 112/73 (01/31 0515) SpO2:  [97 %-100 %] 100 % (01/31 0515)     Intake/Output from previous day: 01/30 0701 - 01/31 0700 In: 1047 [P.O.:942] Out: 1215 [Urine:1205; Chest Tube:10] Intake/Output this shift: No intake/output data recorded.  General appearance: alert, cooperative and no distress Heart: regular rate and rhythm, S1, S2 normal, no murmur, click, rub or gallop Lungs: clear to auscultation bilaterally Abdomen: soft, non-tender; bowel sounds normal; no masses,  no organomegaly Extremities: extremities normal, atraumatic, no cyanosis or edema Wound: clean and dry  Lab Results: No results for input(s): WBC, HGB, HCT, PLT in the last 72 hours. BMET:  Recent Labs  12/01/16 0227  NA 141  K 3.7  CL 105  CO2 28  GLUCOSE 126*  BUN 12  CREATININE 0.93  CALCIUM 9.1    PT/INR: No results for input(s): LABPROT, INR in the last 72 hours. ABG No results found for: PHART, HCO3, TCO2, ACIDBASEDEF, O2SAT CBG (last 3)  No results for input(s): GLUCAP in the last 72 hours.  Assessment/Plan: S/P Procedure(s) (LRB): VIDEO ASSISTED THORACOSCOPY (Left)  1. Chest xray is stable. Air leak present. Plan to do VATs with stapling of blebs on Thursday. On room air without issue.  2. HR and BP stable 3. Milk of magnesium for constipation      LOS: 4 days    Tessa N Conte 12/02/2016  Tonight patient has no air leak, but with the intermittent nature of air leak after 90 % ptx I have recommended to him proceeding with left  VATS as method to decrease risk of recurrent PTX. Patient agreeable. I have seen and examined Zakkery Kirwan and agree with the above assessment  and plan.  Jenesa Foresta B Erielle Gawronski MD Beeper [phone] Office [phone] 12/02/2016 6:57 PM   

## 2016-12-02 NOTE — Progress Notes (Signed)
Pt having C/O pain in chest tube site rating 10/10. Stated PRN diaudid & Oxy didnt relieve pain. Pt requesting something additional. [facility] notified. Orders placed. Will continue to monitor. Gregor HamsAlisha Jesi Jurgens, RN

## 2016-12-02 NOTE — Progress Notes (Signed)
PROGRESS NOTE    Corey Wilson  JXB:[id number] DOB: 12/19/1980 DOA: 11/28/2016 PCP: No primary care provider on file.   Brief Narrative:  Patient is 36 year old male with previous history of pneumothorax on the right 2; 2 years ago he underwent right [facility]ac[facility].Patient previously was a smoker but has not smoked for approximately 4 years no. 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 [facility]/2016 with significant shortness of breath and was found to have a large left pneumothorax. Chest tube was placed and the patient was transferred to [place] for CVTS eval. Dr.Gerhardt following and plans to take patient for VATS with stapling of blebs on 12/03/2016. Had no problems overnight and still complaining of constipation.  Assessment & Plan:   Principal Problem:   Spontaneous pneumothorax Active Problems:   Constipation   Tobacco abuse, in remission   Hx of seizure disorder  Spontaneous Left sided pneumothorax of Emphysematous Bleb -s/p chest tube 1/27 [facility] -CXR 12/01/2016 showed Trace left apical pneumothorax, 5% or less. Left chest wall emphysema is decreased. Chest tube in place with slightly different orientation of the apex. Clear lungs. Normal heart size -Still has Air leak but Saturating well on Room Air -Plan per CVTS -Analgesic with Acetaminophen 650 mg po q6hprn (mild Pain), Oxycodone 5 mg po q6hprn (moderate Pain) and Hydromorphone 1 mg q3hprn (severe pain) -Patient to uindergo VATs with stapling of blebs 12/03/2016 -Continue to follow and appreciate Dr. Sunday ShamsGerhadt's recc's  Constipation -Likely Opioid induced  -C/w Laxatives including Miralax 17 g po BID and Sennosides 5 mL po BID -Milk of Magnesium po Daily PRN   Hx of Tobacco Abuse/Recreational Drug Use -Has not used Tobacco in 4 years per patient -Still smokes marijuana occasionally  Hx of  Seizure -Stable. No Active Issues  DVT prophylaxis: SCD's Code Status: FULL CODE Family Communication: No family present at bedside Disposition Plan: Remain Inpatient for VATS in AM  Consultants:   CVTS Dr. Tyrone SageGerhardt  Procedures: VATS with Stapling of Blebs on Left side scheduled for 12/03/2016  Antimicrobials: None  Subjective: Seen and examined and was doing well. Had Air leak still. No N/V. Pain controlled with current regimen. Admits to being constipated and not having a BM in over a week. No other complaints. Tolerating diet well with no issues.   Objective: Vitals:   12/01/16 0829 12/01/16 1341 12/01/16 2059 12/02/16 0515  BP: 135/78 116/74 133/79 112/73  Pulse: 83 79 87 95  Resp: 18 16 18 18   Temp: 98 F (36.7 C) 98.2 F (36.8 C) 98 F (36.7 C) 98.5 F (36.9 C)  TempSrc: Oral Oral Oral Oral  SpO2: 99% 97% 100% 100%  Weight:      Height:        Intake/Output Summary (Last 24 hours) at 12/02/16 1045 Last data filed at 12/02/16 0900  Gross per 24 hour  Intake              732 ml  Output             1235 ml  Net             -503 ml   Filed Weights   11/28/16 2204  Weight: 71.8 kg (158 lb 6.4 oz)   Examination: Physical Exam:  Constitutional: WN/WD, NAD and appears calm and comfortable Eyes: Lids and conjunctivae normal, sclerae anicteric  ENMT: External Ears, Nose appear normal. Grossly  normal hearing.  Neck: Appears normal, supple, no cervical masses, normal ROM, no appreciable thyromegaly, no JVD Respiratory: Diminished to auscultation bilaterally, no wheezing, rales, rhonchi or crackles. Normal respiratory effort and patient is not tachypenic. No accessory muscle use. Left Chest tube in place connected to [facility] with waterseal.  Cardiovascular: RRR, no murmurs / rubs / gallops. S1 and S2 auscultated. No extremity edema.  Abdomen: Soft, non-tender, non-distended. No masses palpated. No appreciable hepatosplenomegaly. Bowel sounds positive x4. GU:  Deferred. Musculoskeletal: No clubbing / cyanosis of digits/nails. No joint deformity upper and lower extremities. Good ROM, no contractures Skin: No rashes, lesions, ulcers. No induration; Warm and dry. Multiple tattoos noted on Right Arm Neurologic: CN 2-12 grossly intact with no focal deficits. Sensation intact in all 4 Extremities. Romberg sign cerebellar reflexes not assessed.  Psychiatric: Normal judgment and insight. Alert and oriented x 3. Normal mood and appropriate affect.   Data Reviewed: I have personally reviewed following labs and imaging studies  CBC:  Recent Labs Lab 12/02/16 0842  WBC 7.9  NEUTROABS 4.9  HGB 16.1  HCT 48.0  MCV 86.6  PLT 214   Basic Metabolic Panel:  Recent Labs Lab 12/01/16 0227  NA 141  K 3.7  CL 105  CO2 28  GLUCOSE 126*  BUN 12  CREATININE 0.93  CALCIUM 9.1   GFR: Estimated Creatinine Clearance: 113.8 mL/min (by C-G formula based on SCr of 0.93 mg/dL). Liver Function Tests: No results for input(s): AST, ALT, ALKPHOS, BILITOT, PROT, ALBUMIN in the last 168 hours. No results for input(s): LIPASE, AMYLASE in the last 168 hours. No results for input(s): AMMONIA in the last 168 hours. Coagulation Profile:  Recent Labs Lab 11/28/16 2348  INR 1.10   Cardiac Enzymes: No results for input(s): CKTOTAL, CKMB, CKMBINDEX, TROPONINI in the last 168 hours. BNP (last 3 results) No results for input(s): PROBNP in the last 8760 hours. HbA1C: No results for input(s): HGBA1C in the last 72 hours. CBG: No results for input(s): GLUCAP in the last 168 hours. Lipid Profile: No results for input(s): CHOL, HDL, LDLCALC, TRIG, CHOLHDL, LDLDIRECT in the last 72 hours. Thyroid Function Tests: No results for input(s): TSH, T4TOTAL, FREET4, T3FREE, THYROIDAB in the last 72 hours. Anemia Panel: No results for input(s): VITAMINB12, FOLATE, FERRITIN, TIBC, IRON, RETICCTPCT in the last 72 hours. Sepsis Labs: No results for input(s): PROCALCITON,  LATICACIDVEN in the last 168 hours.  No results found for this or any previous visit (from the past 240 hour(s)).   Radiology Studies: Dg Chest Port 1 View  Result Date: 12/01/2016 CLINICAL DATA:  Left-sided chest tube.  Pneumothorax. EXAM: PORTABLE CHEST 1 VIEW COMPARISON:  Yesterday FINDINGS: Trace left apical pneumothorax, 5% or less. Left chest wall emphysema is decreased. Chest tube in place with slightly different orientation of the apex. Clear lungs. Normal heart size. IMPRESSION: Stable trace left apical pneumothorax. Electronically Signed   By: Marnee Spring M.D.   On: 12/01/2016 08:42   Scheduled Meds: . polyethylene glycol  17 g Oral BID  . sennosides  5 mL Oral BID  . sodium chloride flush  3 mL Intravenous Q12H   Continuous Infusions:   LOS: 4 days   Merlene Laughter, DO [facility] Pager [phone]  If 7PM-7AM, please contact night-coverage [email or web address] Password [email or web address] 12/02/2016, 10:45 AM

## 2016-12-03 ENCOUNTER — Inpatient Hospital Stay (HOSPITAL_COMMUNITY): Payer: BLUE CROSS/BLUE SHIELD

## 2016-12-03 ENCOUNTER — Encounter (HOSPITAL_COMMUNITY): Payer: Self-pay | Admitting: Anesthesiology

## 2016-12-03 ENCOUNTER — Inpatient Hospital Stay (HOSPITAL_COMMUNITY): Payer: BLUE CROSS/BLUE SHIELD | Admitting: Anesthesiology

## 2016-12-03 ENCOUNTER — Encounter (HOSPITAL_COMMUNITY)
Admission: AD | Disposition: A | Discharge status: Home / Self Care | Payer: Self-pay | Source: Other Acute Inpatient Hospital | Attending: Internal Medicine

## 2016-12-03 DIAGNOSIS — J930 Spontaneous tension pneumothorax: Secondary | ICD-10-CM

## 2016-12-03 HISTORY — PX: PLEURADESIS: SHX6030

## 2016-12-03 HISTORY — PX: STAPLING OF BLEBS: SHX6429

## 2016-12-03 HISTORY — PX: VIDEO ASSISTED THORACOSCOPY: SHX5073

## 2016-12-03 LAB — URINALYSIS, ROUTINE W REFLEX MICROSCOPIC
BACTERIA UA: NONE SEEN
Bilirubin Urine: NEGATIVE
Glucose, UA: NEGATIVE mg/dL
Ketones, ur: NEGATIVE mg/dL
Leukocytes, UA: NEGATIVE
Nitrite: NEGATIVE
PH: 6 (ref 5.0–8.0)
Protein, ur: NEGATIVE mg/dL
RBC / HPF: NONE SEEN RBC/hpf (ref 0–5)
SPECIFIC GRAVITY, URINE: 1.01 (ref 1.005–1.030)
Squamous Epithelial / LPF: NONE SEEN

## 2016-12-03 LAB — CBC WITH DIFFERENTIAL/PLATELET
Basophils Absolute: 0 10*3/uL (ref 0.0–0.1)
Basophils Relative: 0 %
Eosinophils Absolute: 0.3 10*3/uL (ref 0.0–0.7)
Eosinophils Relative: 4 %
HCT: 49.2 % (ref 39.0–52.0)
HEMOGLOBIN: 17.1 g/dL — AB (ref 13.0–17.0)
LYMPHS ABS: 2.2 10*3/uL (ref 0.7–4.0)
LYMPHS PCT: 28 %
MCH: 29.9 pg (ref 26.0–34.0)
MCHC: 34.8 g/dL (ref 30.0–36.0)
MCV: 86 fL (ref 78.0–100.0)
MONOS PCT: 9 %
Monocytes Absolute: 0.7 10*3/uL (ref 0.1–1.0)
NEUTROS PCT: 59 %
Neutro Abs: 4.8 10*3/uL (ref 1.7–7.7)
Platelets: 239 10*3/uL (ref 150–400)
RBC: 5.72 MIL/uL (ref 4.22–5.81)
RDW: 12 % (ref 11.5–15.5)
WBC: 8 10*3/uL (ref 4.0–10.5)

## 2016-12-03 LAB — GLUCOSE, CAPILLARY
GLUCOSE-CAPILLARY: 134 mg/dL — AB (ref 65–99)
Glucose-Capillary: 164 mg/dL — ABNORMAL HIGH (ref 65–99)

## 2016-12-03 LAB — COMPREHENSIVE METABOLIC PANEL
ALK PHOS: 69 U/L (ref 38–126)
ALT: 16 U/L — AB (ref 17–63)
AST: 18 U/L (ref 15–41)
Albumin: 4.4 g/dL (ref 3.5–5.0)
Anion gap: 9 (ref 5–15)
BILIRUBIN TOTAL: 0.7 mg/dL (ref 0.3–1.2)
BUN: 16 mg/dL (ref 6–20)
CO2: 29 mmol/L (ref 22–32)
CREATININE: 1.11 mg/dL (ref 0.61–1.24)
Calcium: 9.9 mg/dL (ref 8.9–10.3)
Chloride: 103 mmol/L (ref 101–111)
Glucose, Bld: 102 mg/dL — ABNORMAL HIGH (ref 65–99)
Potassium: 5 mmol/L (ref 3.5–5.1)
SODIUM: 141 mmol/L (ref 135–145)
TOTAL PROTEIN: 7.8 g/dL (ref 6.5–8.1)

## 2016-12-03 LAB — PHOSPHORUS: PHOSPHORUS: 4.1 mg/dL (ref 2.5–4.6)

## 2016-12-03 LAB — MAGNESIUM: Magnesium: 2.2 mg/dL (ref 1.7–2.4)

## 2016-12-03 SURGERY — VIDEO ASSISTED THORACOSCOPY
Anesthesia: General | Site: Chest | Laterality: Left

## 2016-12-03 MED ORDER — DIPHENHYDRAMINE HCL 50 MG/ML IJ SOLN: 12.5000 mg | Freq: Four times a day (QID) | INTRAMUSCULAR | Status: DC | PRN

## 2016-12-03 MED ORDER — FENTANYL CITRATE (PF) 100 MCG/2ML IJ SOLN
INTRAMUSCULAR | Status: AC
  Filled 2016-12-03: qty 2

## 2016-12-03 MED ORDER — DIPHENHYDRAMINE HCL 12.5 MG/5ML PO ELIX: 12.5000 mg | ORAL_SOLUTION | Freq: Four times a day (QID) | ORAL | Status: DC | PRN

## 2016-12-03 MED ORDER — SUGAMMADEX SODIUM 200 MG/2ML IV SOLN
INTRAVENOUS | Status: DC | PRN
  Administered 2016-12-03: 200 mg via INTRAVENOUS

## 2016-12-03 MED ORDER — CEFUROXIME SODIUM 1.5 G IJ SOLR
1.5000 g | Freq: Two times a day (BID) | INTRAMUSCULAR | Status: AC
  Administered 2016-12-03 – 2016-12-04 (×2): 1.5 g via INTRAVENOUS
  Filled 2016-12-03 (×2): qty 1.5

## 2016-12-03 MED ORDER — DEXAMETHASONE SODIUM PHOSPHATE 10 MG/ML IJ SOLN
INTRAMUSCULAR | Status: DC | PRN
  Administered 2016-12-03: 10 mg via INTRAVENOUS

## 2016-12-03 MED ORDER — HYDROMORPHONE HCL 1 MG/ML IJ SOLN
0.2500 mg | INTRAMUSCULAR | Status: DC | PRN
  Administered 2016-12-03 (×3): 0.5 mg via INTRAVENOUS

## 2016-12-03 MED ORDER — ONDANSETRON HCL 4 MG/2ML IJ SOLN: 4.0000 mg | Freq: Four times a day (QID) | INTRAMUSCULAR | Status: DC | PRN

## 2016-12-03 MED ORDER — FENTANYL CITRATE (PF) 100 MCG/2ML IJ SOLN
INTRAMUSCULAR | Status: DC | PRN
  Administered 2016-12-03 (×2): 100 ug via INTRAVENOUS

## 2016-12-03 MED ORDER — MIDAZOLAM HCL 2 MG/2ML IJ SOLN
INTRAMUSCULAR | Status: AC
  Filled 2016-12-03: qty 2

## 2016-12-03 MED ORDER — FENTANYL 40 MCG/ML IV SOLN
INTRAVENOUS | Status: AC
  Filled 2016-12-03: qty 25

## 2016-12-03 MED ORDER — PROPOFOL 10 MG/ML IV BOLUS
INTRAVENOUS | Status: DC | PRN
  Administered 2016-12-03: 140 mg via INTRAVENOUS

## 2016-12-03 MED ORDER — KETOROLAC TROMETHAMINE 30 MG/ML IJ SOLN
30.0000 mg | Freq: Once | INTRAMUSCULAR | Status: AC | PRN
  Administered 2016-12-03: 30 mg via INTRAVENOUS

## 2016-12-03 MED ORDER — KETAMINE HCL-SODIUM CHLORIDE 100-0.9 MG/10ML-% IV SOSY
PREFILLED_SYRINGE | INTRAVENOUS | Status: AC
  Filled 2016-12-03: qty 10

## 2016-12-03 MED ORDER — LIDOCAINE HCL (CARDIAC) 20 MG/ML IV SOLN
INTRAVENOUS | Status: DC | PRN
  Administered 2016-12-03: 100 mg via INTRAVENOUS

## 2016-12-03 MED ORDER — SODIUM CHLORIDE 0.9 % IV SOLN
30.0000 meq | Freq: Every day | INTRAVENOUS | Status: DC | PRN
  Filled 2016-12-03: qty 15

## 2016-12-03 MED ORDER — LEVALBUTEROL HCL 0.63 MG/3ML IN NEBU: 0.6300 mg | INHALATION_SOLUTION | Freq: Four times a day (QID) | RESPIRATORY_TRACT | Status: DC | PRN

## 2016-12-03 MED ORDER — HYDROMORPHONE HCL 1 MG/ML IJ SOLN
INTRAMUSCULAR | Status: AC
  Filled 2016-12-03: qty 1

## 2016-12-03 MED ORDER — BISACODYL 5 MG PO TBEC
10.0000 mg | DELAYED_RELEASE_TABLET | Freq: Every day | ORAL | Status: DC
  Administered 2016-12-03 – 2016-12-06 (×4): 10 mg via ORAL
  Filled 2016-12-03 (×4): qty 2

## 2016-12-03 MED ORDER — PHENYLEPHRINE HCL 10 MG/ML IJ SOLN
INTRAMUSCULAR | Status: DC | PRN
  Administered 2016-12-03: 80 ug via INTRAVENOUS

## 2016-12-03 MED ORDER — KETAMINE HCL 10 MG/ML IJ SOLN
INTRAMUSCULAR | Status: DC | PRN
  Administered 2016-12-03: 60 mg via INTRAVENOUS
  Administered 2016-12-03 (×2): 20 mg via INTRAVENOUS

## 2016-12-03 MED ORDER — DEXTROSE 5 % IV SOLN
INTRAVENOUS | Status: DC | PRN
  Administered 2016-12-03: 100 ug/min via INTRAVENOUS

## 2016-12-03 MED ORDER — TRAMADOL HCL 50 MG PO TABS
50.0000 mg | ORAL_TABLET | Freq: Four times a day (QID) | ORAL | Status: DC | PRN
  Administered 2016-12-04 – 2016-12-05 (×4): 100 mg via ORAL
  Filled 2016-12-03 (×4): qty 2

## 2016-12-03 MED ORDER — SODIUM CHLORIDE 0.9% FLUSH: 9.0000 mL | INTRAVENOUS | Status: DC | PRN

## 2016-12-03 MED ORDER — SENNOSIDES-DOCUSATE SODIUM 8.6-50 MG PO TABS
1.0000 | ORAL_TABLET | Freq: Every day | ORAL | Status: DC
  Administered 2016-12-03: 1 via ORAL
  Filled 2016-12-03 (×2): qty 1

## 2016-12-03 MED ORDER — HYDROMORPHONE HCL 1 MG/ML IJ SOLN
1.0000 mg | INTRAMUSCULAR | Status: DC
  Filled 2016-12-03: qty 1

## 2016-12-03 MED ORDER — ACETAMINOPHEN 500 MG PO TABS
1000.0000 mg | ORAL_TABLET | Freq: Four times a day (QID) | ORAL | Status: DC
  Administered 2016-12-03 – 2016-12-05 (×5): 1000 mg via ORAL
  Filled 2016-12-03 (×5): qty 2

## 2016-12-03 MED ORDER — ACETAMINOPHEN 160 MG/5ML PO SOLN: 1000.0000 mg | Freq: Four times a day (QID) | ORAL | Status: DC

## 2016-12-03 MED ORDER — OXYCODONE HCL 5 MG PO TABS
5.0000 mg | ORAL_TABLET | ORAL | Status: DC | PRN
  Administered 2016-12-03 (×2): 5 mg via ORAL
  Administered 2016-12-04 – 2016-12-05 (×6): 10 mg via ORAL
  Filled 2016-12-03 (×6): qty 2
  Filled 2016-12-03: qty 1
  Filled 2016-12-03: qty 2

## 2016-12-03 MED ORDER — PROMETHAZINE HCL 25 MG/ML IJ SOLN: 6.2500 mg | INTRAMUSCULAR | Status: DC | PRN

## 2016-12-03 MED ORDER — ACETAMINOPHEN 10 MG/ML IV SOLN
1000.0000 mg | Freq: Once | INTRAVENOUS | Status: AC | PRN
  Administered 2016-12-03: 1000 mg via INTRAVENOUS

## 2016-12-03 MED ORDER — ROCURONIUM BROMIDE 50 MG/5ML IV SOSY
PREFILLED_SYRINGE | INTRAVENOUS | Status: AC
  Filled 2016-12-03: qty 5

## 2016-12-03 MED ORDER — ACETAMINOPHEN 10 MG/ML IV SOLN
INTRAVENOUS | Status: AC
  Filled 2016-12-03: qty 100

## 2016-12-03 MED ORDER — LIDOCAINE 2% (20 MG/ML) 5 ML SYRINGE
INTRAMUSCULAR | Status: AC
  Filled 2016-12-03: qty 5

## 2016-12-03 MED ORDER — FENTANYL 40 MCG/ML IV SOLN
INTRAVENOUS | Status: AC
  Administered 2016-12-03: 285 ug via INTRAVENOUS
  Administered 2016-12-03: 15:00:00 via INTRAVENOUS
  Administered 2016-12-04: 135 ug via INTRAVENOUS
  Administered 2016-12-04: 1000 ug via INTRAVENOUS
  Administered 2016-12-04: 95 ug via INTRAVENOUS
  Administered 2016-12-05: 165 ug via INTRAVENOUS
  Administered 2016-12-05: 135 ug via INTRAVENOUS
  Filled 2016-12-03: qty 25

## 2016-12-03 MED ORDER — INSULIN ASPART 100 UNIT/ML ~~LOC~~ SOLN
0.0000 [IU] | SUBCUTANEOUS | Status: DC
  Administered 2016-12-04: 2 [IU] via SUBCUTANEOUS

## 2016-12-03 MED ORDER — PROPOFOL 10 MG/ML IV BOLUS
INTRAVENOUS | Status: AC
  Filled 2016-12-03: qty 20

## 2016-12-03 MED ORDER — LEVALBUTEROL HCL 0.63 MG/3ML IN NEBU
0.6300 mg | INHALATION_SOLUTION | Freq: Four times a day (QID) | RESPIRATORY_TRACT | Status: DC
  Administered 2016-12-03: 0.63 mg via RESPIRATORY_TRACT
  Filled 2016-12-03: qty 3

## 2016-12-03 MED ORDER — MEPERIDINE HCL 25 MG/ML IJ SOLN: 6.2500 mg | INTRAMUSCULAR | Status: DC | PRN

## 2016-12-03 MED ORDER — PHENYLEPHRINE 40 MCG/ML (10ML) SYRINGE FOR IV PUSH (FOR BLOOD PRESSURE SUPPORT)
PREFILLED_SYRINGE | INTRAVENOUS | Status: AC
  Filled 2016-12-03: qty 10

## 2016-12-03 MED ORDER — KETOROLAC TROMETHAMINE 30 MG/ML IJ SOLN
INTRAMUSCULAR | Status: AC
  Filled 2016-12-03: qty 1

## 2016-12-03 MED ORDER — ONDANSETRON HCL 4 MG/2ML IJ SOLN
INTRAMUSCULAR | Status: DC | PRN
  Administered 2016-12-03 (×2): 4 mg via INTRAVENOUS

## 2016-12-03 MED ORDER — DEXTROSE-NACL 5-0.45 % IV SOLN
INTRAVENOUS | Status: DC
  Administered 2016-12-03 – 2016-12-04 (×2): via INTRAVENOUS

## 2016-12-03 MED ORDER — ROCURONIUM BROMIDE 100 MG/10ML IV SOLN
INTRAVENOUS | Status: DC | PRN
  Administered 2016-12-03: 50 mg via INTRAVENOUS

## 2016-12-03 MED ORDER — LACTATED RINGERS IV SOLN
INTRAVENOUS | Status: DC | PRN
  Administered 2016-12-03 (×2): via INTRAVENOUS

## 2016-12-03 MED ORDER — NALOXONE HCL 0.4 MG/ML IJ SOLN: 0.4000 mg | INTRAMUSCULAR | Status: DC | PRN

## 2016-12-03 MED ORDER — FENTANYL CITRATE (PF) 100 MCG/2ML IJ SOLN
INTRAMUSCULAR | Status: AC
  Filled 2016-12-03: qty 4

## 2016-12-03 MED ORDER — SODIUM CHLORIDE 0.9 % IR SOLN
Status: DC | PRN
  Administered 2016-12-03: 3000 mL

## 2016-12-03 MED ORDER — MIDAZOLAM HCL 5 MG/5ML IJ SOLN
INTRAMUSCULAR | Status: DC | PRN
  Administered 2016-12-03: 2 mg via INTRAVENOUS

## 2016-12-03 MED ORDER — MIDAZOLAM HCL 2 MG/2ML IJ SOLN
2.0000 mg | Freq: Once | INTRAMUSCULAR | Status: AC
  Administered 2016-12-03: 2 mg via INTRAVENOUS

## 2016-12-03 SURGICAL SUPPLY — 75 items
APPLICATOR TIP COSEAL (VASCULAR PRODUCTS) IMPLANT
APPLICATOR TIP EXT COSEAL (VASCULAR PRODUCTS) IMPLANT
BLADE SURG 11 STRL SS (BLADE) ×2 IMPLANT
CANISTER SUCTION 2500CC (MISCELLANEOUS) ×2 IMPLANT
CATH KIT ON Q 5IN SLV (PAIN MANAGEMENT) IMPLANT
CATH THORACIC 28FR (CATHETERS) IMPLANT
CATH THORACIC 36FR (CATHETERS) IMPLANT
CATH THORACIC 36FR RT ANG (CATHETERS) IMPLANT
CLEANER TIP ELECTROSURG 2X2 (MISCELLANEOUS) ×4 IMPLANT
CLIP TI MEDIUM 6 (CLIP) ×2 IMPLANT
CONN ST 1/4X3/8  BEN (MISCELLANEOUS) ×1
CONN ST 1/4X3/8 BEN (MISCELLANEOUS) ×1 IMPLANT
CONT SPEC 4OZ CLIKSEAL STRL BL (MISCELLANEOUS) ×2 IMPLANT
DERMABOND ADVANCED (GAUZE/BANDAGES/DRESSINGS) ×1
DERMABOND ADVANCED .7 DNX12 (GAUZE/BANDAGES/DRESSINGS) ×1 IMPLANT
DRAIN CHANNEL 28F RND 3/8 FF (WOUND CARE) ×2 IMPLANT
DRAPE LAPAROSCOPIC ABDOMINAL (DRAPES) ×2 IMPLANT
DRAPE WARM FLUID 44X44 (DRAPE) IMPLANT
DRILL BIT 7/64X5 (BIT) IMPLANT
ELECT BLADE 4.0 EZ CLEAN MEGAD (MISCELLANEOUS) ×2
ELECT REM PT RETURN 9FT ADLT (ELECTROSURGICAL) ×2
ELECTRODE BLDE 4.0 EZ CLN MEGD (MISCELLANEOUS) ×1 IMPLANT
ELECTRODE REM PT RTRN 9FT ADLT (ELECTROSURGICAL) ×1 IMPLANT
GAUZE SPONGE 4X4 12PLY STRL (GAUZE/BANDAGES/DRESSINGS) ×2 IMPLANT
GLOVE BIO SURGEON STRL SZ 6.5 (GLOVE) ×4 IMPLANT
GLOVE BIOGEL PI IND STRL 6.5 (GLOVE) ×1 IMPLANT
GLOVE BIOGEL PI INDICATOR 6.5 (GLOVE) ×1
GLOVE SURG SS PI 6.5 STRL IVOR (GLOVE) ×2 IMPLANT
GOWN STRL REUS W/ TWL LRG LVL3 (GOWN DISPOSABLE) ×4 IMPLANT
GOWN STRL REUS W/TWL LRG LVL3 (GOWN DISPOSABLE) ×4
KIT BASIN OR (CUSTOM PROCEDURE TRAY) ×2 IMPLANT
KIT ROOM TURNOVER OR (KITS) ×2 IMPLANT
KIT SUCTION CATH 14FR (SUCTIONS) ×2 IMPLANT
NS IRRIG 1000ML POUR BTL (IV SOLUTION) ×4 IMPLANT
PACK CHEST (CUSTOM PROCEDURE TRAY) ×2 IMPLANT
PAD ARMBOARD 7.5X6 YLW CONV (MISCELLANEOUS) ×4 IMPLANT
PASSER SUT SWANSON 36MM LOOP (INSTRUMENTS) IMPLANT
RELOAD STAPLE 45 3.5 BLU ETS (ENDOMECHANICALS) ×2 IMPLANT
RELOAD STAPLE 45 GOLD REG/THCK (STAPLE) ×1 IMPLANT
RELOAD STAPLE TA45 3.5 REG BLU (ENDOMECHANICALS) ×4 IMPLANT
SEALANT PROGEL (MISCELLANEOUS) IMPLANT
SEALANT SURG COSEAL 4ML (VASCULAR PRODUCTS) IMPLANT
SEALANT SURG COSEAL 8ML (VASCULAR PRODUCTS) IMPLANT
SOLUTION ANTI FOG 6CC (MISCELLANEOUS) ×2 IMPLANT
STAPLE RELOAD 45MM GOLD (STAPLE) ×2 IMPLANT
STAPLER ENDO NO KNIFE (STAPLE) ×2 IMPLANT
SUT ETHILON 3 0 PS 1 (SUTURE) ×2 IMPLANT
SUT PROLENE 3 0 SH DA (SUTURE) IMPLANT
SUT PROLENE 4 0 RB 1 (SUTURE)
SUT PROLENE 4-0 RB1 .5 CRCL 36 (SUTURE) IMPLANT
SUT SILK  1 MH (SUTURE) ×2
SUT SILK 1 MH (SUTURE) ×2 IMPLANT
SUT SILK 2 0SH CR/8 30 (SUTURE) IMPLANT
SUT SILK 3 0SH CR/8 30 (SUTURE) IMPLANT
SUT VIC AB 1 CTX 18 (SUTURE) IMPLANT
SUT VIC AB 1 CTX 36 (SUTURE)
SUT VIC AB 1 CTX36XBRD ANBCTR (SUTURE) IMPLANT
SUT VIC AB 2-0 CTX 36 (SUTURE) IMPLANT
SUT VIC AB 2-0 UR6 27 (SUTURE) ×2 IMPLANT
SUT VIC AB 3-0 SH 8-18 (SUTURE) ×2 IMPLANT
SUT VIC AB 3-0 X1 27 (SUTURE) ×2 IMPLANT
SUT VICRYL 2 TP 1 (SUTURE) IMPLANT
SWAB COLLECTION DEVICE MRSA (MISCELLANEOUS) IMPLANT
SYSTEM SAHARA CHEST DRAIN ATS (WOUND CARE) ×2 IMPLANT
TAPE CLOTH 4X10 WHT NS (GAUZE/BANDAGES/DRESSINGS) ×2 IMPLANT
TAPE CLOTH SURG 6X10 WHT LF (GAUZE/BANDAGES/DRESSINGS) ×2 IMPLANT
TIP APPLICATOR SPRAY EXTEND 16 (VASCULAR PRODUCTS) IMPLANT
TOWEL OR 17X24 6PK STRL BLUE (TOWEL DISPOSABLE) IMPLANT
TOWEL OR 17X26 10 PK STRL BLUE (TOWEL DISPOSABLE) IMPLANT
TRAP SPECIMEN MUCOUS 40CC (MISCELLANEOUS) IMPLANT
TRAY FOLEY CATH SILVER 16FR LF (SET/KITS/TRAYS/PACK) IMPLANT
TROCAR BLADELESS 12MM (ENDOMECHANICALS) IMPLANT
TROCAR XCEL NON-BLD 11X100MML (ENDOMECHANICALS) ×2 IMPLANT
TUBE ANAEROBIC SPECIMEN COL (MISCELLANEOUS) IMPLANT
WATER STERILE IRR 1000ML POUR (IV SOLUTION) ×2 IMPLANT

## 2016-12-03 NOTE — Anesthesia Postprocedure Evaluation (Signed)
Anesthesia Post Note  Patient: Corey Wilson  Procedure(s) Performed: Procedure(s) (LRB): LEFT VIDEO ASSISTED THORACOSCOPY (Left) STAPLING OF APICAL BLEBS LEFT LUNG (Left) MECHANCAL PLEURADESIS LEFT LUNG (Left)  Patient location during evaluation: PACU Anesthesia Type: General Level of consciousness: sedated and patient cooperative Pain management: pain level controlled Vital Signs Assessment: post-procedure vital signs reviewed and stable Respiratory status: spontaneous breathing Cardiovascular status: stable Anesthetic complications: no       Last Vitals:  Vitals:   12/03/16 1547 12/03/16 1600  BP: 119/77   Pulse: 84 80  Resp: 11 10  Temp: 36.5 C     Last Pain:  Vitals:   12/03/16 1547  TempSrc:   PainSc: Asleep                 Lewie LoronJohn Yoltzin Ransom

## 2016-12-03 NOTE — Transfer of Care (Signed)
Immediate Anesthesia Transfer of Care Note  Patient: Corey Wilson  Procedure(s) Performed: Procedure(s): LEFT VIDEO ASSISTED THORACOSCOPY (Left) STAPLING OF APICAL BLEBS LEFT LUNG (Left) MECHANCAL PLEURADESIS LEFT LUNG (Left)  Patient Location: PACU  Anesthesia Type:General  Level of Consciousness: awake, alert  and oriented  Airway & Oxygen Therapy: Patient Spontanous Breathing and Patient connected to face mask oxygen  Post-op Assessment: Report given to RN, Post -op Vital signs reviewed and stable and Patient moving all extremities X 4  Post vital signs: Reviewed and stable  Last Vitals:  Vitals:   12/02/16 2055 12/03/16 0454  BP: 130/78 101/74  Pulse: 97 92  Resp: 18   Temp: 36.8 C 36.3 C    Last Pain:  Vitals:   12/03/16 0834  TempSrc:   PainSc: 5       Patients Stated Pain Goal: 0 (12/02/16 2015)  Complications: No apparent anesthesia complications

## 2016-12-03 NOTE — Interval H&P Note (Signed)
preop progress  Interval Note:  12/03/2016 6:47 AM  Corey Wilson  has presented today for surgery, with the diagnosis of AIR LEAK  The various methods of treatment have been discussed with the patient and family. After consideration of risks, benefits and other options for treatment, the patient has consented to  Procedure(s): VIDEO ASSISTED THORACOSCOPY (Left) as a surgical intervention .  The patient's history has been reviewed, patient examined, no change in status, stable for surgery.  I have reviewed the patient's chart and labs.  Questions were answered to the patient's satisfaction.   Lab Results  Component Value Date   WBC 8.0 12/03/2016   HGB 17.1 (H) 12/03/2016   HCT 49.2 12/03/2016   PLT 239 12/03/2016   GLUCOSE 102 (H) 12/03/2016   ALT 16 (L) 12/03/2016   AST 18 12/03/2016   NA 141 12/03/2016   K 5.0 12/03/2016   CL 103 12/03/2016   CREATININE 1.11 12/03/2016   BUN 16 12/03/2016   CO2 29 12/03/2016   INR 1.08 12/02/2016     Delight OvensEdward B Carrigan Delafuente

## 2016-12-03 NOTE — Brief Op Note (Addendum)
11/28/2016 - 12/03/2016  1:30 PM  PATIENT:  Corey Wilson  36 y.o. male  PRE-OPERATIVE DIAGNOSIS:  AIR LEAK  POST-OPERATIVE DIAGNOSIS:  AIR LEAK  PROCEDURE:  Procedure(s): LEFT VIDEO ASSISTED THORACOSCOPY (Left) STAPLING OF APICAL BLEBS LEFT LUNG (Left) MECHANCAL PLEURADESIS LEFT LUNG (Left)  SURGEON:  Surgeon(s) and Role:    * Delight OvensEdward B Abishai Viegas, MD - Primary  PHYSICIAN ASSISTANT:  Jari Favreessa Conte, PA-C   ANESTHESIA:   general  EBL:  Total I/O In: 1500 [I.V.:1500] Out: 0   BLOOD ADMINISTERED:none  DRAINS: left chest tube   LOCAL MEDICATIONS USED:  NONE  SPECIMEN:  No Specimen  DISPOSITION OF SPECIMEN:  N/A  COUNTS:  YES  TOURNIQUET:  * No tourniquets in log *  DICTATION: .Dragon Dictation  PLAN OF CARE: Admit to inpatient   PATIENT DISPOSITION:  ICU - extubated and stable.   Delay start of Pharmacological VTE agent (>24hrs) due to surgical blood loss or risk of bleeding: yes

## 2016-12-03 NOTE — Progress Notes (Signed)
PROGRESS NOTE    Corey Wilson  ZOX:[id number] DOB: Nov 14, 1980 DOA: 11/28/2016 PCP: No primary care provider on file.   Brief Narrative:  Patient is 36 year old male with previous history of pneumothorax on the right 2; 2 years ago he underwent right video-assisted thoracoscopy at [facility].Patient previously was a smoker but has not smoked for approximately 4 years no. 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 He presented to the [facility] emergency room 11/28/2016 with significant shortness of breath and was found to have a large left pneumothorax. Chest tube was placed and the patient was transferred to [place] for CVTS eval. Dr.Gerhardt following and plans to take patient for VATS with stapling of blebs on 12/03/2016. Had no problems and constipation is resolving. Complained of Air Leak again yesterday  Assessment & Plan:   Principal Problem:   Spontaneous pneumothorax Active Problems:   Constipation   Tobacco abuse, in remission   Hx of seizure disorder   Shortness of breath  Spontaneous Left sided pneumothorax of Emphysematous Bleb -s/p chest tube 1/27 at [facility] ER -CXR 12/01/2016 showed Trace left apical pneumothorax, 5% or less. Left chest wall emphysema is decreased. Chest tube in place with slightly different orientation of the apex. Clear lungs. Normal heart size -Still has Air leak but Saturating well on Room Air -Plan per CVTS -Had some chest wall pain last night; c/w  Analgesic with Acetaminophen 650 mg po q6hprn (mild Pain), Oxycodone 5 mg po q6hprn (moderate Pain) and Hydromorphone 1 mg q3hprn (severe pain) -ABG showed 7.424/44.2/79.6/28.4/95.9% -Patient to uindergo VATs with stapling of blebs today because of Air Leak -Continue to follow and appreciate Dr. Sunday Shams recc's  Constipation, improved -Likely Opioid induced  -C/w Laxatives including Miralax 17 g po BID and Sennosides 5 mL po BID -Milk of Magnesium po  Daily PRN   Hx of Tobacco Abuse/Recreational Drug Use -Has not used Tobacco in 4 years per patient -Still smokes marijuana Occasionally  Hx of Seizure -Stable. No Active Issues  DVT prophylaxis: SCD's Code Status: FULL CODE Family Communication: No family present at bedside Disposition Plan: Remain Inpatient for VATS in AM  Consultants:   CVTS Dr. Tyrone Sage  Procedures: VATS with Stapling of Blebs on Left side today  Antimicrobials: PREOPERATIVE Anti-infectives    Start     Dose/Rate Route Frequency Ordered Stop   12/03/16 0800  cefUROXime (ZINACEF) 1.5 g in dextrose 5 % 50 mL IVPB     1.5 g 100 mL/hr over 30 Minutes Intravenous To [facility] 12/02/16 1448 12/04/16 0800      Subjective: Overnight complained of chest tube site pain. Constipation has resolved. No other complaints or concerns. To go for VATS today  Objective: Vitals:   12/02/16 0515 12/02/16 1318 12/02/16 2055 12/03/16 0454  BP: 112/73 139/82 130/78 101/74  Pulse: 95 98 97 92  Resp: 18 20 18    Temp: 98.5 F (36.9 C) 98 F (36.7 C) 98.3 F (36.8 C) 97.3 F (36.3 C)  TempSrc: Oral Oral Oral Oral  SpO2: 100% 100% 99% 100%  Weight:      Height:        Intake/Output Summary (Last 24 hours) at 12/03/16 0727 Last data filed at 12/02/16 1830  Gross per 24 hour  Intake              720 ml  Output              800 ml  Net              -  80 ml   Filed Weights   11/28/16 2204  Weight: 71.8 kg (158 lb 6.4 oz)   Examination: Physical Exam:  Constitutional: WN/WD, NAD and appears calm and comfortable Eyes: Lids and conjunctivae normal, sclerae anicteric  ENMT: External Ears, Nose appear normal. Grossly normal hearing.  Neck: Appears normal, supple, no cervical masses, normal ROM, no appreciable thyromegaly, no JVD Respiratory: Diminished to auscultation bilaterally, no wheezing, rales, rhonchi or crackles. Normal respiratory effort and patient is not tachypenic. No accessory muscle use. Left  Chest tube in place connected to [facility] with waterseal.  Cardiovascular: RRR, no murmurs / rubs / gallops. S1 and S2 auscultated. No extremity edema.  Abdomen: Soft, non-tender, non-distended. No masses palpated. No appreciable hepatosplenomegaly. Bowel sounds positive x4. GU: Deferred. Musculoskeletal: No clubbing / cyanosis of digits/nails. No joint deformity upper and lower extremities. Good ROM, no contractures Skin: No rashes, lesions, ulcers. No induration; Warm and dry. Multiple tattoos noted on Right Arm Neurologic: CN 2-12 grossly intact with no focal deficits. Sensation intact in all 4 Extremities. Romberg sign cerebellar reflexes not assessed.  Psychiatric: Normal judgment and insight. Alert and oriented x 3. Normal mood and appropriate affect.   Data Reviewed: I have personally reviewed following labs and imaging studies  CBC:  Recent Labs Lab 12/02/16 0842 12/02/16 1421 12/02/16 1459 12/03/16 0222  WBC 7.9 9.2 8.8 8.0  NEUTROABS 4.9 6.7  --  4.8  HGB 16.1 16.5 16.8 17.1*  HCT 48.0 48.2 48.8 49.2  MCV 86.6 85.9 86.2 86.0  PLT 214 241 242 239   Basic Metabolic Panel:  Recent Labs Lab 12/01/16 0227 12/02/16 1421 12/02/16 1459 12/03/16 0222  NA 141 142 141 141  K 3.7 4.4 4.9 5.0  CL 105 99* 101 103  CO2 28 31 31 29   GLUCOSE 126* 87 101* 102*  BUN 12 14 14 16   CREATININE 0.93 1.04 1.12 1.11  CALCIUM 9.1 10.1 10.2 9.9  MG  --   --   --  2.2  PHOS  --   --   --  4.1   GFR: Estimated Creatinine Clearance: 95.4 mL/min (by C-G formula based on SCr of 1.11 mg/dL). Liver Function Tests:  Recent Labs Lab 12/02/16 1421 12/02/16 1459 12/03/16 0222  AST 15 15 18   ALT 15* 14* 16*  ALKPHOS 66 63 69  BILITOT 0.8 0.7 0.7  PROT 7.2 7.5 7.8  ALBUMIN 3.9 4.2 4.4   No results for input(s): LIPASE, AMYLASE in the last 168 hours. No results for input(s): AMMONIA in the last 168 hours. Coagulation Profile:  Recent Labs Lab 11/28/16 2348 12/02/16 1459  INR 1.10  1.08   Cardiac Enzymes: No results for input(s): CKTOTAL, CKMB, CKMBINDEX, TROPONINI in the last 168 hours. BNP (last 3 results) No results for input(s): PROBNP in the last 8760 hours. HbA1C: No results for input(s): HGBA1C in the last 72 hours. CBG: No results for input(s): GLUCAP in the last 168 hours. Lipid Profile: No results for input(s): CHOL, HDL, LDLCALC, TRIG, CHOLHDL, LDLDIRECT in the last 72 hours. Thyroid Function Tests: No results for input(s): TSH, T4TOTAL, FREET4, T3FREE, THYROIDAB in the last 72 hours. Anemia Panel: No results for input(s): VITAMINB12, FOLATE, FERRITIN, TIBC, IRON, RETICCTPCT in the last 72 hours. Sepsis Labs: No results for input(s): PROCALCITON, LATICACIDVEN in the last 168 hours.  No results found for this or any previous visit (from the past 240 hour(s)).   Radiology Studies: Dg Chest Port 1 View  Result Date: 12/01/2016 CLINICAL  DATA:  Left-sided chest tube.  Pneumothorax. EXAM: PORTABLE CHEST 1 VIEW COMPARISON:  Yesterday FINDINGS: Trace left apical pneumothorax, 5% or less. Left chest wall emphysema is decreased. Chest tube in place with slightly different orientation of the apex. Clear lungs. Normal heart size. IMPRESSION: Stable trace left apical pneumothorax. Electronically Signed   By: Marnee SpringJonathon  Watts M.D.   On: 12/01/2016 08:42   Scheduled Meds: . cefUROXime (ZINACEF)  IV  1.5 g Intravenous To SS-Surg  . polyethylene glycol  17 g Oral BID  . sennosides  5 mL Oral BID  . sodium chloride flush  3 mL Intravenous Q12H   Continuous Infusions:   LOS: 5 days   Merlene Laughtermair Latif Mialani Reicks, DO [facility] Pager [phone]  If 7PM-7AM, please contact night-coverage [email or web address] Password [email or web address] 12/03/2016, 7:27 AM

## 2016-12-03 NOTE — Progress Notes (Signed)
Patient ambulated in the PACU hallways, went around twice. Patient claims it felt good and tolerated ambulation well.

## 2016-12-03 NOTE — Anesthesia Procedure Notes (Signed)
Procedure Name: Intubation Date/Time: 12/03/2016 11:48 AM Performed by: Kyung Rudd Pre-anesthesia Checklist: Patient identified, Emergency Drugs available, Suction available and Patient being monitored Patient Re-evaluated:Patient Re-evaluated prior to inductionOxygen Delivery Method: Circle system utilized Preoxygenation: Pre-oxygenation with 100% oxygen Intubation Type: IV induction Ventilation: Mask ventilation without difficulty Laryngoscope Size: Mac and 4 Grade View: Grade I Endobronchial tube: Left, Double lumen EBT, EBT position confirmed by auscultation and EBT position confirmed by fiberoptic bronchoscope and 39 Fr Number of attempts: 1 Airway Equipment and Method: Stylet Placement Confirmation: ETT inserted through vocal cords under direct vision,  positive ETCO2 and breath sounds checked- equal and bilateral Tube secured with: Tape Dental Injury: Teeth and Oropharynx as per pre-operative assessment

## 2016-12-03 NOTE — H&P (View-Only) (Signed)
       [address]       [place] [id number]             [phone]        Procedure(s) (LRB): VIDEO ASSISTED THORACOSCOPY (Left) Subjective: No issues this morning. Feels okay.   Objective: Vital signs in last 24 hours: Temp:  [98 F (36.7 C)-98.5 F (36.9 C)] 98.5 F (36.9 C) (01/31 0515) Pulse Rate:  [79-95] 95 (01/31 0515) Cardiac Rhythm: Normal sinus rhythm (01/31 0701) Resp:  [16-18] 18 (01/31 0515) BP: (112-135)/(73-79) 112/73 (01/31 0515) SpO2:  [97 %-100 %] 100 % (01/31 0515)     Intake/Output from previous day: 01/30 0701 - 01/31 0700 In: 1047 [P.O.:942] Out: 1215 [Urine:1205; Chest Tube:10] Intake/Output this shift: No intake/output data recorded.  General appearance: alert, cooperative and no distress Heart: regular rate and rhythm, S1, S2 normal, no murmur, click, rub or gallop Lungs: clear to auscultation bilaterally Abdomen: soft, non-tender; bowel sounds normal; no masses,  no organomegaly Extremities: extremities normal, atraumatic, no cyanosis or edema Wound: clean and dry  Lab Results: No results for input(s): WBC, HGB, HCT, PLT in the last 72 hours. BMET:  Recent Labs  12/01/16 0227  NA 141  K 3.7  CL 105  CO2 28  GLUCOSE 126*  BUN 12  CREATININE 0.93  CALCIUM 9.1    PT/INR: No results for input(s): LABPROT, INR in the last 72 hours. ABG No results found for: PHART, HCO3, TCO2, ACIDBASEDEF, O2SAT CBG (last 3)  No results for input(s): GLUCAP in the last 72 hours.  Assessment/Plan: S/P Procedure(s) (LRB): VIDEO ASSISTED THORACOSCOPY (Left)  1. Chest xray is stable. Air leak present. Plan to do VATs with stapling of blebs on Thursday. On room air without issue.  2. HR and BP stable 3. Milk of magnesium for constipation      LOS: 4 days    Corey Wilson 12/02/2016  Tonight patient has no air leak, but with the intermittent nature of air leak after 90 % ptx I have recommended to him proceeding with left  VATS as method to decrease risk of recurrent PTX. Patient agreeable. I have seen and examined Corey Wilson and agree with the above assessment  and plan.  Corey OvensEdward B Gerhardt MD [facility] [phone] Office [phone] 12/02/2016 6:57 PM

## 2016-12-04 ENCOUNTER — Inpatient Hospital Stay (HOSPITAL_COMMUNITY): Payer: BLUE CROSS/BLUE SHIELD

## 2016-12-04 ENCOUNTER — Encounter (HOSPITAL_COMMUNITY): Payer: Self-pay | Admitting: Cardiothoracic Surgery

## 2016-12-04 LAB — BASIC METABOLIC PANEL
Anion gap: 10 (ref 5–15)
BUN: 15 mg/dL (ref 6–20)
CHLORIDE: 102 mmol/L (ref 101–111)
CO2: 26 mmol/L (ref 22–32)
CREATININE: 1.08 mg/dL (ref 0.61–1.24)
Calcium: 9.7 mg/dL (ref 8.9–10.3)
GFR calc non Af Amer: >60 mL/min (ref >60)
Glucose, Bld: 128 mg/dL — ABNORMAL HIGH (ref 65–99)
Potassium: 4.8 mmol/L (ref 3.5–5.1)
Sodium: 138 mmol/L (ref 135–145)

## 2016-12-04 LAB — CBC
HEMATOCRIT: 45 % (ref 39.0–52.0)
HEMOGLOBIN: 15.3 g/dL (ref 13.0–17.0)
MCH: 29.1 pg (ref 26.0–34.0)
MCHC: 34 g/dL (ref 30.0–36.0)
MCV: 85.7 fL (ref 78.0–100.0)
Platelets: 280 10*3/uL (ref 150–400)
RBC: 5.25 MIL/uL (ref 4.22–5.81)
RDW: 11.9 % (ref 11.5–15.5)
WBC: 12.2 10*3/uL — ABNORMAL HIGH (ref 4.0–10.5)

## 2016-12-04 LAB — MRSA PCR SCREENING: MRSA by PCR: NEGATIVE

## 2016-12-04 LAB — GLUCOSE, CAPILLARY
GLUCOSE-CAPILLARY: 206 mg/dL — AB (ref 65–99)
Glucose-Capillary: 99 mg/dL (ref 65–99)
Glucose-Capillary: 103 mg/dL — ABNORMAL HIGH (ref 65–99)

## 2016-12-04 MED ORDER — FAMOTIDINE 20 MG PO TABS
20.0000 mg | ORAL_TABLET | Freq: Two times a day (BID) | ORAL | Status: DC | PRN
Start: 2016-12-04 — End: 2016-12-06
  Administered 2016-12-04: 20 mg via ORAL
  Filled 2016-12-04: qty 1

## 2016-12-04 MED ORDER — KETOROLAC TROMETHAMINE 30 MG/ML IJ SOLN
30.0000 mg | Freq: Once | INTRAMUSCULAR | Status: AC
  Administered 2016-12-04: 30 mg via INTRAVENOUS
  Filled 2016-12-04: qty 1

## 2016-12-04 MED ORDER — KETOROLAC TROMETHAMINE 30 MG/ML IJ SOLN: 30.0000 mg | Freq: Four times a day (QID) | INTRAMUSCULAR | Status: DC | PRN

## 2016-12-04 MED ORDER — LUNG SURGERY BOOK
Freq: Once | Status: AC
  Administered 2016-12-04: 04:00:00
  Filled 2016-12-04: qty 1

## 2016-12-04 NOTE — Progress Notes (Signed)
12/04/2016 1700 Received pt to 2W22 from 2S.  Pt is A&O, no c/o voiced.  Tele monitor applied and CCMD notified.  Pt rates pain a 4 at this time, PCA Pump reviewed and button given to pt.  Chest tube site WNl, drainage system secure on floor.  Oriented to room, call light and bed.  Call bell in reach. Kathryne HitchAllen, Anayah Arvanitis C

## 2016-12-04 NOTE — Progress Notes (Signed)
Pt caught "vaping" in room. Pt aware that vaping not allowed in hospital and ICU.   Pt refused to send home vape pipe home with sister.  Pt ambulated to 2W22 (transfer) 600 feet without distress. [facility]

## 2016-12-04 NOTE — Progress Notes (Addendum)
      [address]       [place] [id number]             [phone]      1 Day Post-Op Procedure(s) (LRB): LEFT VIDEO ASSISTED THORACOSCOPY (Left) STAPLING OF APICAL BLEBS LEFT LUNG (Left) MECHANCAL PLEURADESIS LEFT LUNG (Left) Subjective: Pain is uncontrolled, however using PCA and getting Toradol.   Objective: Vital signs in last 24 hours: Temp:  [97.7 F (36.5 C)-99.2 F (37.3 C)] 99.2 F (37.3 C) (02/02 0410) Pulse Rate:  [78-134] 80 (02/02 0800) Cardiac Rhythm: Normal sinus rhythm (02/02 0750) Resp:  [8-30] 17 (02/02 0800) BP: (119-165)/(76-100) 132/84 (02/02 0600) SpO2:  [92 %-100 %] 100 % (02/02 0800) Weight:  [69.9 kg (154 lb)] 69.9 kg (154 lb) (02/02 0600)     Intake/Output from previous day: 02/01 0701 - 02/02 0700 In: 2935 [P.O.:200; I.V.:2685; IV Piggyback:50] Out: 1895 [Urine:1840; Blood:25; Chest Tube:30] Intake/Output this shift: Total I/O In: 100 [I.V.:100] Out: -   General appearance: alert, cooperative and no distress Heart: regular rate and rhythm, S1, S2 normal, no murmur, click, rub or gallop Lungs: clear to auscultation bilaterally Abdomen: soft, non-tender; bowel sounds normal; no masses,  no organomegaly Extremities: extremities normal, atraumatic, no cyanosis or edema Wound: clean and dry  Lab Results:  Recent Labs  12/03/16 0222 12/04/16 0351  WBC 8.0 12.2*  HGB 17.1* 15.3  HCT 49.2 45.0  PLT 239 280   BMET:  Recent Labs  12/03/16 0222 12/04/16 0351  NA 141 138  K 5.0 4.8  CL 103 102  CO2 29 26  GLUCOSE 102* 128*  BUN 16 15  CREATININE 1.11 1.08  CALCIUM 9.9 9.7    PT/INR:  Recent Labs  12/02/16 1459  LABPROT 14.0  INR 1.08   ABG    Component Value Date/Time   PHART 7.424 12/02/2016 1547   HCO3 28.4 (H) 12/02/2016 1547   O2SAT 95.9 12/02/2016 1547   CBG (last 3)   Recent Labs  12/03/16 2225 12/04/16 0002 12/04/16 0402  GLUCAP 164* 134* 99    Assessment/Plan: S/P Procedure(s)  (LRB): LEFT VIDEO ASSISTED THORACOSCOPY (Left) STAPLING OF APICAL BLEBS LEFT LUNG (Left) MECHANCAL PLEURADESIS LEFT LUNG (Left)  1. CV-NSR in the 80s with good blood pressure.  2. Pulm-on room air with good oxygen saturation. CXR stable with no residual pneumothorax. Chest tube 30cc/24 hours.  3. Renal- creatinine 1.08. Electrolytes okay. Good urine output.  4. Blood glucose level with moderate control.  5. H and H stable 6. Pain control- PCA with one time dose of Toradol 7. CIWA protocol? Showing withdrawal symptoms according to nursing.    Plan: Progressing well.  Working on pain control. Transfer to telemetry unit. Chest tube to waterseal.    LOS: 6 days    Sharlene Doryessa N Conte 12/04/2016  No air leak, ct to water seal, d/c tomorrow if xray ok and home 1-2 days  I have seen and examined Corey Wilson and agree with the above assessment  and plan.  Delight OvensEdward B Mateja Dier MD [facility] [phone] Office [phone] 12/04/2016 3:36 PM

## 2016-12-04 NOTE — Progress Notes (Signed)
PROGRESS NOTE    Yehuda SavannahChauncy Flinders  UJW:[id number] DOB: 04/20/1981 DOA: 11/28/2016 PCP: No primary care provider on file.   Brief Narrative:  Patient is 36 year old male with previous history of pneumothorax on the right 2; 2 years ago he [facility].Patient previously was a smoker but has not smoked for approximately 4 years no. 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 [facility]/28/2016 with significant shortness of breath and was found to have a large left pneumothorax. Chest tube was placed and the patient was transferred to [facility] for CVTS eval. Dr.Gerhardt following and patient taken for VATS with stapling of blebs on 12/03/2016 for Pneumothorax with Air Leak. Extubated and on Fentanyl PCA and being transferred to Telemetry.   Assessment & Plan:   Principal Problem:   Spontaneous pneumothorax Active Problems:   Constipation   Tobacco abuse, in remission   Hx of seizure disorder   Shortness of breath  Spontaneous Left sided pneumothorax of Emphysematous Bleb with Air Leak s/p VATS, Stapling of Apical Blebs and Mechanical Pleurodesis of Left Lung POD 1 -s/p chest tube 1/27 at [facility]; Chest Tube to water seal -CXR 12/01/2016 showed Trace left apical pneumothorax, 5% or less. Left chest wall emphysema is decreased. Chest tube in place with slightly different orientation of the apex. Clear lungs. Normal heart size -Repeat CXR for this AM 12/04/16 showed stable left apical chest tube. Surgical sutures overlie the bilateral lung apices. Stable cardiomediastinal silhouette with normal heart size. No pneumothorax. No pleural effusion. Lungs appear clear, with no acute consolidative airspace disease and no pulmonary edema. -Plan per CVTS; Currently being transferred from SICU to Telemetry -c/w Analgesia per CVTS with Acetaminophen 1000 mg po q6h, Fentanyl PCA, and  1x dose of 30 mg of Ketorolac, Oxycodone IR 5-10 mg po q4hprn and with Tramadol 50-100 mg po q6hprn -s/p POD 1 VATS, Stapling of Apical Blebs and Mechanical Pleurodesis of Left Lung -Bowel Regimen with 1 tablet of Senna-Doucsate po qHS and Bisacodyl 10 mg po Daily -IVF Rehydration with D5W 1/2NS at 100 mL/hr -Continue to follow and appreciate CVTS Dr. Sunday ShamsGerhadt's recc's; Possible D/C of Chest Tube tomorrow if X-Ray ok  Mild Leukocytosis -Likely Reactive to Above -WBC went from 8.0 -> 12.2 -Continue to Monitor for S/Sx of Infection -Repeat CBC in AM  Constipation, improved -Likely Opioid induced  -C/w Bowel Regimen with 1 tablet of Senna-Doucsate po qHS and Bisacodyl 10 mg po Daily -Continue to Monitor  Hx of Tobacco Abuse/Recreational Drug Use -Has not used Tobacco in 4 years per patient -Still smokes marijuana Occasionally; Counseling given  Hx of Seizure -Stable. No Active Issues  Hyperglycemia -Likely stress related  -Patient also on D5W 1/2NS -Has CBG Monitoring q4h and Novolog sq Scale  DVT prophylaxis: SCD's Code Status: FULL CODE Family Communication: No family present at bedside Disposition Plan: Transfer to Telemetry per CVTS  Consultants:   CVTS Dr. Tyrone SageGerhardt  Procedures: VATS with Stapling of Blebs on Left side today  Antimicrobials: PREOPERATIVE Anti-infectives    Start     Dose/Rate Route Frequency Ordered Stop   12/03/16 2330  cefUROXime (ZINACEF) 1.5 g in dextrose 5 % 50 mL IVPB     1.5 g 100 mL/hr over 30 Minutes Intravenous Every 12 hours 12/03/16 2154 12/04/16 2329   12/03/16 0800  cefUROXime (ZINACEF) 1.5 g in dextrose 5 % 50 mL IVPB     1.5  g 100 mL/hr over 30 Minutes Intravenous To [facility] 12/02/16 1448 12/03/16 1205     Subjective: Seen and examined and was in a lot of pain and stated Fentanyl PCA wasn't helping much. No N/V and states no air leak today. States last night was rough and did not sleep much. No other concerns or  complaints currently.   Objective: Vitals:   12/04/16 0420 12/04/16 0500 12/04/16 0600 12/04/16 0700  BP:  (!) 145/86 132/84   Pulse:  (!) 103 (!) 107 (!) 119  Resp: (!) 23 19 20 17   Temp:      TempSrc:      SpO2: 100% 100% 97% 99%  Weight:      Height:        Intake/Output Summary (Last 24 hours) at 12/04/16 0734 Last data filed at 12/04/16 0400  Gross per 24 hour  Intake             2150 ml  Output             1695 ml  Net              455 ml   Filed Weights   11/28/16 2204  Weight: 71.8 kg (158 lb 6.4 oz)   Examination: Physical Exam:  Constitutional: WN/WD, NAD and appears mildly uncomfortable Eyes: Lids and conjunctivae normal, sclerae anicteric  ENMT: External Ears, Nose appear normal. Grossly normal hearing.  Neck: Appears normal, supple, no cervical masses, normal ROM, no appreciable thyromegaly, no JVD Respiratory: Diminished to auscultation bilaterally, no wheezing, rales, rhonchi or crackles. Normal respiratory effort and patient is not tachypenic. No accessory muscle use. Left Chest tube in place connected to [facility] with waterseal and no Air Leak.  Cardiovascular: RRR, no murmurs / rubs / gallops. S1 and S2 auscultated. No extremity edema.  Abdomen: Soft, non-tender, non-distended. No masses palpated. No appreciable hepatosplenomegaly. Bowel sounds positive x4. GU: Deferred. Musculoskeletal: No clubbing / cyanosis of digits/nails. No joint deformity upper and lower extremities. Good ROM, no contractures Skin: No rashes, lesions, ulcers. No induration; Warm and dry. Multiple tattoos noted on Right Arm Neurologic: CN 2-12 grossly intact with no focal deficits. Sensation intact in all 4 Extremities. Romberg sign cerebellar reflexes not assessed.  Psychiatric: Normal judgment and insight. Alert and oriented x 3. Normal mood and appropriate affect.   Data Reviewed: I have personally reviewed following labs and imaging studies  CBC:  Recent Labs Lab 12/02/16 0842  12/02/16 1421 12/02/16 1459 12/03/16 0222 12/04/16 0351  WBC 7.9 9.2 8.8 8.0 12.2*  NEUTROABS 4.9 6.7  --  4.8  --   HGB 16.1 16.5 16.8 17.1* 15.3  HCT 48.0 48.2 48.8 49.2 45.0  MCV 86.6 85.9 86.2 86.0 85.7  PLT 214 241 242 239 280   Basic Metabolic Panel:  Recent Labs Lab 12/01/16 0227 12/02/16 1421 12/02/16 1459 12/03/16 0222 12/04/16 0351  NA 141 142 141 141 138  K 3.7 4.4 4.9 5.0 4.8  CL 105 99* 101 103 102  CO2 28 31 31 29 26   GLUCOSE 126* 87 101* 102* 128*  BUN 12 14 14 16 15   CREATININE 0.93 1.04 4.09 1.11 1.08  CALCIUM 9.1 10.1 10.2 9.9 9.7  MG  --   --   --  2.2  --   PHOS  --   --   --  4.1  --    GFR: Estimated Creatinine Clearance: 98 mL/min (by C-G formula based on SCr of 1.08 mg/dL). Liver  Function Tests:  Recent Labs Lab 12/02/16 1421 12/02/16 1459 12/03/16 0222  AST 15 15 18   ALT 15* 14* 16*  ALKPHOS 66 63 69  BILITOT 0.8 0.7 0.7  PROT 7.2 7.5 7.8  ALBUMIN 3.9 4.2 4.4   No results for input(s): LIPASE, AMYLASE in the last 168 hours. No results for input(s): AMMONIA in the last 168 hours. Coagulation Profile:  Recent Labs Lab 11/28/16 2348 12/02/16 1459  INR 1.10 1.08   Cardiac Enzymes: No results for input(s): CKTOTAL, CKMB, CKMBINDEX, TROPONINI in the last 168 hours. BNP (last 3 results) No results for input(s): PROBNP in the last 8760 hours. HbA1C: No results for input(s): HGBA1C in the last 72 hours. CBG:  Recent Labs Lab 12/03/16 2225 12/04/16 0002 12/04/16 0402  GLUCAP 164* 134* 99   Lipid Profile: No results for input(s): CHOL, HDL, LDLCALC, TRIG, CHOLHDL, LDLDIRECT in the last 72 hours. Thyroid Function Tests: No results for input(s): TSH, T4TOTAL, FREET4, T3FREE, THYROIDAB in the last 72 hours. Anemia Panel: No results for input(s): VITAMINB12, FOLATE, FERRITIN, TIBC, IRON, RETICCTPCT in the last 72 hours. Sepsis Labs: No results for input(s): PROCALCITON, LATICACIDVEN in the last 168 hours.  Recent Results  (from the past 240 hour(s))  MRSA PCR Screening     Status: None   Collection Time: 12/03/16  9:58 PM  Result Value Ref Range Status   MRSA by PCR NEGATIVE NEGATIVE Final    Comment:        The GeneXpert MRSA Assay (FDA approved for NASAL specimens only), is one component of a comprehensive MRSA colonization surveillance program. It is not intended to diagnose MRSA infection nor to guide or monitor treatment for MRSA infections.     Radiology Studies: Dg Chest Port 1 View  Result Date: 12/03/2016 CLINICAL DATA:  Post thoracotomy EXAM: PORTABLE CHEST 1 VIEW COMPARISON:  12/01/2016 FINDINGS: Cardiomediastinal silhouette is stable. Left chest tube with tip in left upper medial hemithorax. No infiltrate or pulmonary edema. Trace residual left apical pneumothorax with improvement from prior exam. IMPRESSION: Left chest tube with tip in left upper medial hemithorax. No infiltrate or pulmonary edema. Trace residual left apical pneumothorax with improvement from prior exam. Electronically Signed   By: Natasha Mead M.D.   On: 12/03/2016 15:16   Scheduled Meds: . acetaminophen  1,000 mg Oral Q6H   Or  . acetaminophen (TYLENOL) oral liquid 160 mg/5 mL  1,000 mg Oral Q6H  . bisacodyl  10 mg Oral Daily  . cefUROXime (ZINACEF)  IV  1.5 g Intravenous Q12H  . fentaNYL   Intravenous Q4H  . insulin aspart  0-24 Units Subcutaneous Q4H  . senna-docusate  1 tablet Oral QHS   Continuous Infusions: . dextrose 5 % and 0.45% NaCl 100 mL/hr at 12/04/16 0400    LOS: 6 days   Merlene Laughter, DO [facility] Pager [phone]  If 7PM-7AM, please contact night-coverage [email or web address] Password [facility] 12/04/2016, 7:34 AM

## 2016-12-04 NOTE — Discharge Instructions (Signed)
Pneumothorax °Introduction °A pneumothorax, commonly called a collapsed lung, is a condition in which air leaks from a lung and builds up in the space between the lung and the chest wall (pleural space). The air in a pneumothorax is trapped outside the lung and takes up space, preventing the lung from fully expanding. This is a condition that usually occurs suddenly. The buildup of air may be small or large. A small pneumothorax may go away on its own. When a pneumothorax is larger, it will often require medical treatment and hospitalization. °What are the causes? °A pneumothorax can sometimes happen quickly with no apparent cause. People with underlying lung problems, particularly COPD or emphysema, are at higher risk of pneumothorax. However, pneumothorax can happen quickly even in people with no prior known lung problems. Trauma, surgery, medical procedures, or injury to the chest wall can also cause a pneumothorax. °What are the signs or symptoms? °Sometimes a pneumothorax will have no symptoms. When symptoms are present, they can include: °· Chest pain. °· Shortness of breath. °· Increased rate of breathing. °· Bluish color to your lips or skin (cyanosis). °How is this diagnosed? °Pneumothorax is usually diagnosed by a chest X-ray or chest CT scan. Your health care provider will also take a medical history and perform a physical exam to determine why you may have a pneumothorax. °How is this treated? °A small pneumothorax may go away on its own without treatment. Extra oxygen can sometimes help a small pneumothorax go away more quickly. For a larger pneumothorax or a pneumothorax that is causing symptoms, a procedure is usually needed to drain the air. In some cases, the health care provider may drain the air using a needle. In other cases, a chest tube may be inserted into the pleural space. A chest tube is a small tube placed between the ribs and into the pleural space. This removes the extra air and allows  the lung to expand back to its normal size. A large pneumothorax will usually require a hospital stay. If there is ongoing air leakage into the pleural space, then the chest tube may need to remain in place for several days until the air leak has healed. In some cases, surgery may be needed. °Follow these instructions at home: °· Only take over-the-counter or prescription medicines as directed by your health care provider. °· If a cough or pain makes it difficult for you to sleep at night, try sleeping in a semi-upright position in a recliner or by using 2 or 3 pillows. °· Rest and limit activity as directed by your health care provider. °· If you had a chest tube and it was removed, ask your health care provider when it is okay to remove the dressing. Until your health care provider says you can remove the dressing, do not allow it to get wet. °· Do not smoke. Smoking is a risk factor for pneumothorax. °· Do not fly in an airplane or scuba dive until your health care provider says it is okay. °· Follow up with your health care provider as directed. °Get help right away if: °· You have increasing chest pain or shortness of breath. °· You have a cough that is not controlled with suppressants. °· You begin coughing up blood. °· You have pain that is getting worse or is not controlled with medicines. °· You cough up thick, discolored mucus (sputum) that is yellow to green in color. °· You have redness, increasing pain, or discharge at the site where a   chest tube had been in place (if your pneumothorax was treated with a chest tube). °· The site where your chest tube was located opens up. °· You feel air coming out of the site where the chest tube was placed. °· You have a fever or persistent symptoms for more than 2-3 days. °· You have a fever and your symptoms suddenly get worse. °This information is not intended to replace advice given to you by your health care provider. Make sure you discuss any questions you have  with your health care provider. °Document Released: 10/19/2005 Document Revised: 03/26/2016 Document Reviewed: 03/14/2014 °© 2017 [employer] ° °

## 2016-12-05 ENCOUNTER — Inpatient Hospital Stay (HOSPITAL_COMMUNITY): Payer: BLUE CROSS/BLUE SHIELD

## 2016-12-05 LAB — COMPREHENSIVE METABOLIC PANEL
ALBUMIN: 3.7 g/dL (ref 3.5–5.0)
ALK PHOS: 55 U/L (ref 38–126)
ALT: 14 U/L — AB (ref 17–63)
AST: 17 U/L (ref 15–41)
Anion gap: 5 (ref 5–15)
BUN: 11 mg/dL (ref 6–20)
CALCIUM: 9.4 mg/dL (ref 8.9–10.3)
CO2: 29 mmol/L (ref 22–32)
CREATININE: 1.12 mg/dL (ref 0.61–1.24)
Chloride: 106 mmol/L (ref 101–111)
GFR calc Af Amer: >60 mL/min (ref >60)
GFR calc non Af Amer: >60 mL/min (ref >60)
GLUCOSE: 95 mg/dL (ref 65–99)
Potassium: 4.5 mmol/L (ref 3.5–5.1)
SODIUM: 140 mmol/L (ref 135–145)
Total Bilirubin: 0.9 mg/dL (ref 0.3–1.2)
Total Protein: 6.7 g/dL (ref 6.5–8.1)

## 2016-12-05 LAB — CBC
HCT: 45.3 % (ref 39.0–52.0)
HEMOGLOBIN: 15 g/dL (ref 13.0–17.0)
MCH: 29 pg (ref 26.0–34.0)
MCHC: 33.1 g/dL (ref 30.0–36.0)
MCV: 87.5 fL (ref 78.0–100.0)
Platelets: 239 10*3/uL (ref 150–400)
RBC: 5.18 MIL/uL (ref 4.22–5.81)
RDW: 12.4 % (ref 11.5–15.5)
WBC: 10 10*3/uL (ref 4.0–10.5)

## 2016-12-05 LAB — PHOSPHORUS: Phosphorus: 4.2 mg/dL (ref 2.5–4.6)

## 2016-12-05 LAB — GLUCOSE, CAPILLARY: GLUCOSE-CAPILLARY: 100 mg/dL — AB (ref 65–99)

## 2016-12-05 LAB — MAGNESIUM: Magnesium: 1.9 mg/dL (ref 1.7–2.4)

## 2016-12-05 NOTE — Progress Notes (Signed)
PROGRESS NOTE    Corey Wilson  WUJ:[id number] DOB: [birth year] DOA: 11/28/2016 PCP: No primary care provider on file.   Brief Narrative:  Patient is 36 year old male with previous history of pneumothorax on the right 2; 2 years ago he underwent right video-assisted thoracoscopy at [facility] hospital.Patient previously was a smoker but has not smoked for approximately 4 years no. 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 He presented to the [facility] emergency room 11/28/2016 with significant shortness of breath and was found to have a large left pneumothorax. Chest tube was placed and the patient was transferred to [place] for CVTS eval. Dr.Gerhardt following and patient taken for VATS with stapling of blebs on 12/03/2016 for Pneumothorax with Air Leak. Extubated and on Fentanyl PCA. On medical floor now and possible D/C of Chest Tube.   Assessment & Plan:   Principal Problem:   Spontaneous pneumothorax Active Problems:   Constipation   Tobacco abuse, in remission   Hx of seizure disorder   Shortness of breath  Spontaneous Left sided pneumothorax of Emphysematous Bleb with Air Leak s/p VATS, Stapling of Apical Blebs and Mechanical Pleurodesis of Left Lung POD 2 -s/p chest tube 1/27 at [facility]; Chest Tube to water seal -CXR 12/01/2016 showed Trace left apical pneumothorax, 5% or less. Left chest wall emphysema is decreased. Chest tube in place with slightly different orientation of the apex. Clear lungs. Normal heart size -CXR 12/04/16 showed stable left apical chest tube. Surgical sutures overlie the bilateral lung apices. Stable cardiomediastinal silhouette with normal heart size. No pneumothorax. No pleural effusion. Lungs appear clear, with no acute consolidative airspace disease and no pulmonary edema. -c/w Analgesia per CVTS with Acetaminophen 1000 mg po q6h, Fentanyl PCA, and 1x dose of 30 mg of Ketorolac, Oxycodone IR 5-10 mg po q4hprn and  with Tramadol 50-100 mg po q6hprn -s/p POD 2 VATS, Stapling of Apical Blebs and Mechanical Pleurodesis of Left Lung -Bowel Regimen with 1 tablet of Senna-Doucsate po qHS and Bisacodyl 10 mg po Daily -IVF Rehydration with D5W 1/2NS at 100 mL/hr now Discontinued -Levalbuterol Nebulizer 0.63 mg Nebulized q6hprn -Continue to follow and appreciate CVTS Dr. Sunday Shams recc's;  -CXR on 12/05/16 showed Interval development of minimal left apical pneumothorax. Left-sided chest tube is unchanged in position. -Possible D/C of Chest Tube and removal today per CVTS  Mild Leukocytosis -Improved -Likely Reactive to Above -WBC went from 8.0 -> 12.2 -> 10.0 -Continue to Monitor for S/Sx of Infection -Repeat CBC in AM  Constipation, improved -Likely Opioid induced  -C/w Bowel Regimen with 1 tablet of Senna-Doucsate po qHS and Bisacodyl 10 mg po Daily -Continue to Monitor  Hx of Tobacco Abuse/Recreational Drug Use -Has not used Tobacco in 4 years per patient -Still smokes marijuana Occasionally; Counseling given -Patient was using a Vape in Room; Counseling given and told patient he was not allowed to vape in the hospital   Hx of Seizure -Stable. No Active Issues  Hyperglycemia -Improved -Likely stress related  -Patient also on D5W 1/2NS -Has CBG Monitoring q4h and Novolog sq Scale -CBG's have been running 100-206  DVT prophylaxis: SCD's Code Status: FULL CODE Family Communication: No family present at bedside Disposition Plan: Remain on Medical Floor; Possible D/C of Chest Tube  Consultants:   CVTS Dr. Tyrone Sage  Procedures: VATS with Stapling of Blebs on Left side 12/03/16  Antimicrobials: PREOPERATIVE Anti-infectives    Start     Dose/Rate Route Frequency Ordered Stop  12/03/16 2330  cefUROXime (ZINACEF) 1.5 g in dextrose 5 % 50 mL IVPB     1.5 g 100 mL/hr over 30 Minutes Intravenous Every 12 hours 12/03/16 2154 12/04/16 1005   12/03/16 0800  cefUROXime (ZINACEF) 1.5 g in dextrose 5  % 50 mL IVPB     1.5 g 100 mL/hr over 30 Minutes [facility] 12/02/16 1448 12/03/16 1205     Subjective: Patient states his night was a lot better. Pain better controlled. Was found using a Vape in the room. Advised and told to stop. No Air Leak. No other concerns or complaints and is ambulating well.   Objective: Vitals:   12/04/16 2100 12/05/16 0000 12/05/16 0400 12/05/16 0636  BP: (!) 142/91   (!) 145/85  Pulse: 90   94  Resp: 18 16 18    Temp: 97.9 F (36.6 C)   97.8 F (36.6 C)  TempSrc: Oral   Oral  SpO2: 99% 99% 100% 100%  Weight:      Height:        Intake/Output Summary (Last 24 hours) at 12/05/16 0740 Last data filed at 12/04/16 1500  Gross per 24 hour  Intake              800 ml  Output             1000 ml  Net             -200 ml   Filed Weights   11/28/16 2204 12/04/16 0600  Weight: 71.8 kg (158 lb 6.4 oz) 69.9 kg (154 lb)   Examination: Physical Exam:  Constitutional: WN/WD, NAD and appears calm and comfortable Eyes: Lids and conjunctivae normal, sclerae anicteric  ENMT: External Ears, Nose appear normal. Grossly normal hearing.  Neck: Appears normal, supple, no cervical masses, normal ROM, no appreciable thyromegaly, no JVD Respiratory: Diminished to auscultation bilaterally, no wheezing, rales, rhonchi or crackles. Normal respiratory effort and patient is not tachypenic. No accessory muscle use. Left Chest tube in place connected to [facility] with waterseal and no Air Leak.  Cardiovascular: RRR, no murmurs / rubs / gallops. S1 and S2 auscultated. No extremity edema.  Abdomen: Soft, non-tender, non-distended. No masses palpated. No appreciable hepatosplenomegaly. Bowel sounds positive x4. GU: Deferred. Musculoskeletal: No clubbing / cyanosis of digits/nails. No joint deformity upper and lower extremities. Good ROM, no contractures Skin: No rashes, lesions, ulcers. No induration; Warm and dry. Multiple tattoos noted on Right  Arm Neurologic: CN 2-12 grossly intact with no focal deficits. Sensation intact in all 4 Extremities. Romberg sign cerebellar reflexes not assessed.  Psychiatric: Normal judgment and insight. Alert and oriented x 3. Normal mood and appropriate affect.   Data Reviewed: I have personally reviewed following labs and imaging studies  CBC:  Recent Labs Lab 12/02/16 0842 12/02/16 1421 12/02/16 1459 12/03/16 0222 12/04/16 0351 12/05/16 0406  WBC 7.9 9.2 8.8 8.0 12.2* 10.0  NEUTROABS 4.9 6.7  --  4.8  --   --   HGB 16.1 16.5 16.8 17.1* 15.3 15.0  HCT 48.0 48.2 48.8 49.2 45.0 45.3  MCV 86.6 85.9 86.2 86.0 85.7 87.5  PLT 214 241 242 239 280 239   Basic Metabolic Panel:  Recent Labs Lab 12/02/16 1421 12/02/16 1459 12/03/16 0222 12/04/16 0351 12/05/16 0406  NA 142 141 141 138 140  K 4.4 4.9 5.0 4.8 4.5  CL 99* 101 103 102 106  CO2 31 31 29 26 29   GLUCOSE 87 101* 102* 128* 95  BUN 14 14 16 15 11   CREATININE 1.04 1.12 1.11 1.08 1.12  CALCIUM 10.1 10.2 9.9 9.7 9.4  MG  --   --  2.2  --  1.9  PHOS  --   --  4.1  --  4.2   GFR: Estimated Creatinine Clearance: 91.9 mL/min (by C-G formula based on SCr of 1.12 mg/dL). Liver Function Tests:  Recent Labs Lab 12/02/16 1421 12/02/16 1459 12/03/16 0222 12/05/16 0406  AST 15 15 18 17   ALT 15* 14* 16* 14*  ALKPHOS 66 63 69 55  BILITOT 0.8 0.7 0.7 0.9  PROT 7.2 7.5 7.8 6.7  ALBUMIN 3.9 4.2 4.4 3.7   No results for input(s): LIPASE, AMYLASE in the last 168 hours. No results for input(s): AMMONIA in the last 168 hours. Coagulation Profile:  Recent Labs Lab 11/28/16 2348 12/02/16 1459  INR 1.10 1.08   Cardiac Enzymes: No results for input(s): CKTOTAL, CKMB, CKMBINDEX, TROPONINI in the last 168 hours. BNP (last 3 results) No results for input(s): PROBNP in the last 8760 hours. HbA1C: No results for input(s): HGBA1C in the last 72 hours. CBG:  Recent Labs Lab 12/04/16 0002 12/04/16 0402 12/04/16 0923 12/04/16 1340  12/04/16 1629  GLUCAP 134* 99 206* 100* 103*   Lipid Profile: No results for input(s): CHOL, HDL, LDLCALC, TRIG, CHOLHDL, LDLDIRECT in the last 72 hours. Thyroid Function Tests: No results for input(s): TSH, T4TOTAL, FREET4, T3FREE, THYROIDAB in the last 72 hours. Anemia Panel: No results for input(s): VITAMINB12, FOLATE, FERRITIN, TIBC, IRON, RETICCTPCT in the last 72 hours. Sepsis Labs: No results for input(s): PROCALCITON, LATICACIDVEN in the last 168 hours.  Recent Results (from the past 240 hour(s))  MRSA PCR Screening     Status: None   Collection Time: 12/03/16  9:58 PM  Result Value Ref Range Status   MRSA by PCR NEGATIVE NEGATIVE Final    Comment:        The GeneXpert MRSA Assay (FDA approved for NASAL specimens only), is one component of a comprehensive MRSA colonization surveillance program. It is not intended to diagnose MRSA infection nor to guide or monitor treatment for MRSA infections.     Radiology Studies: Dg Chest Port 1 View  Result Date: 12/04/2016 CLINICAL DATA:  Chest tube, VATS, thoracotomy EXAM: PORTABLE CHEST 1 VIEW COMPARISON:  Chest radiograph from one day prior. FINDINGS: Stable left apical chest tube. Surgical sutures overlie the bilateral lung apices. Stable cardiomediastinal silhouette with normal heart size. No pneumothorax. No pleural effusion. Lungs appear clear, with no acute consolidative airspace disease and no pulmonary edema. IMPRESSION: No residual pneumothorax.  No active disease. Electronically Signed   By: Delbert Phenix M.D.   On: 12/04/2016 07:43   Dg Chest Port 1 View  Result Date: 12/03/2016 CLINICAL DATA:  Post thoracotomy EXAM: PORTABLE CHEST 1 VIEW COMPARISON:  12/01/2016 FINDINGS: Cardiomediastinal silhouette is stable. Left chest tube with tip in left upper medial hemithorax. No infiltrate or pulmonary edema. Trace residual left apical pneumothorax with improvement from prior exam. IMPRESSION: Left chest tube with tip in left upper  medial hemithorax. No infiltrate or pulmonary edema. Trace residual left apical pneumothorax with improvement from prior exam. Electronically Signed   By: Natasha Mead M.D.   On: 12/03/2016 15:16   Scheduled Meds: . acetaminophen  1,000 mg Oral Q6H   Or  . acetaminophen (TYLENOL) oral liquid 160 mg/5 mL  1,000 mg Oral Q6H  . bisacodyl  10 mg Oral Daily  . fentaNYL  Intravenous Q4H  . insulin aspart  0-24 Units Subcutaneous Q4H  . senna-docusate  1 tablet Oral QHS   Continuous Infusions: . dextrose 5 % and 0.45% NaCl 100 mL/hr at 12/04/16 1014    LOS: 7 days   Merlene Laughter, DO [facility] Pager [phone]  If 7PM-7AM, please contact night-coverage [email or web address] Password [email or web address] 12/05/2016, 7:40 AM

## 2016-12-05 NOTE — Op Note (Signed)
NAMKalman Wilson:  Corey Wilson, Corey Wilson               ACCOUNT NO.:  [id number]  MEDICAL RECORD NO.:  [id number]  LOCATION:  [facility]                         FACILITY:  [facility]  PHYSICIAN:  Sheliah PlaneEdward Lucella Pommier, MD    DATE OF BIRTH:  1981/07/05  DATE OF PROCEDURE:  12/03/2016 DATE OF DISCHARGE:                              OPERATIVE REPORT   PREOPERATIVE DIAGNOSIS:  Spontaneous left pneumothorax with apical blebs.  POSTOPERATIVE DIAGNOSIS:  Spontaneous left pneumothorax with apical blebs.  SURGICAL PROCEDURE:  Left video-assisted thoracoscopy with stapling of apical blebs and mechanical pleurodesis.  SURGEON:  Sheliah PlaneEdward Jarad Barth, M.D.  FIRST ASSISTANT:  Boris Lownessa Conti, PA.  BRIEF HISTORY:  The patient is a 36 year old male with a previous history of spontaneous pneumothorax on the right side ultimately treated with surgical intervention the patient had a year and a half ago.  The patient had done well until the week of admission when he had increasing shortness of breath and chest discomfort.  It was found on chest x-ray to have a 90% left pneumothorax.  The [facility].  A chest tube was placed there.  [facility]  On initial presentation, he had a well-inflated lung, with a good position of chest tube and no air leak.  However as we placed the chest tube to water seal, he began over the next day and a half having intermittent air leaks.  After discussion with the patient about the chance of recurrence and intermittent persistent nature of the current air leak, we recommended proceeding with surgical intervention. The patient agreed and signed informed consent.  DESCRIPTION OF PROCEDURE:  The patient underwent general endotracheal anesthesia without incident.  A double-lumen endotracheal tube was placed and bronchoscopy performed by Anesthesia showed good position of the endotracheal tube.  The patient was turned in lateral decubitus position with the  left side up which had preoperatively but marked chest tube that was in place was removed.  The left chest was prepped with Betadine and draped in usual sterile manner.  approximately the 4th intercostal space, In the axillary line, a small incision was made and after deflating the lung, the left chest was entered with a trocar and a 30-degree videoscope was introduced.  Careful examination of the lung was carried out.  The head was put in the down position.  Small amount of saline was placed into the chest.  Initially could not demonstrate any definite air leak, slightly more anterior.  Port was placed in between scope in the port site.  The lung was manipulated and approximately 1-1.5 cm bleb was easily identified at the apex of the lung.  We then selected a 45 mm no-knife stapler and fired stapler across the very apex of the lung excluding the blebs and surrounding tissue.  We again tested for air leak.  We then used a small abrasive pad performed a mechanical pleurodesis around the upper part of the chest.  The most posterior port site was used as our chest tube insertion site.  The anterior port was closed.  A 28 Blake drain was left in the chest.  It was secured in place.  The lung was  reinflated. There was no air leak.  Sponge and needle count was reported as correct at completion of procedure.  The anterior port site was closed with interrupted 0 Vicryl and a running 3-0 subcuticular stitch.  Dermabond was applied.  The patient was awakened and extubated in the operating room and transferred to the recovery room having tolerated the procedure without obvious complication.  Estimated blood loss was minimal.     Sheliah Plane, MD     EG/MEDQ  D:  12/04/2016  T:  12/05/2016  Job:  [id number]

## 2016-12-05 NOTE — Progress Notes (Signed)
12/05/2016 1400 Chest tube removed per order and protocol.  Pt tolerated well. Kathryne HitchAllen, Jordi Lacko C

## 2016-12-05 NOTE — Progress Notes (Signed)
12/05/2016 0830 Pt up walking around the unit, on RA and no use of walker.  Tolerating well. Kathryne HitchAllen, Jhoan Schmieder C

## 2016-12-05 NOTE — Progress Notes (Addendum)
      [address]       [place] [id number]             [phone]      2 Days Post-Op Procedure(s) (LRB): LEFT VIDEO ASSISTED THORACOSCOPY (Left) STAPLING OF APICAL BLEBS LEFT LUNG (Left) MECHANCAL PLEURADESIS LEFT LUNG (Left) Subjective: Upset about the care he received last night. No other issues. Slept well.   Objective: Vital signs in last 24 hours: Temp:  [97.7 F (36.5 C)-98.7 F (37.1 C)] 97.8 F (36.6 C) (02/03 0636) Pulse Rate:  [62-109] 94 (02/03 0636) Cardiac Rhythm: Normal sinus rhythm;Bundle branch block (02/02 2241) Resp:  [12-20] 18 (02/03 0400) BP: (97-145)/(53-96) 145/85 (02/03 0636) SpO2:  [97 %-100 %] 100 % (02/03 0636)     Intake/Output from previous day: 02/02 0701 - 02/03 0700 In: 800 [I.V.:800] Out: 1000 [Urine:800; Chest Tube:200] Intake/Output this shift: No intake/output data recorded.  General appearance: alert, cooperative and no distress Heart: regular rate and rhythm, S1, S2 normal, no murmur, click, rub or gallop Lungs: clear to auscultation bilaterally Abdomen: soft, non-tender; bowel sounds normal; no masses,  no organomegaly Extremities: extremities normal, atraumatic, no cyanosis or edema Wound: clean and dry  Lab Results:  Recent Labs  12/04/16 0351 12/05/16 0406  WBC 12.2* 10.0  HGB 15.3 15.0  HCT 45.0 45.3  PLT 280 239   BMET:  Recent Labs  12/04/16 0351 12/05/16 0406  NA 138 140  K 4.8 4.5  CL 102 106  CO2 26 29  GLUCOSE 128* 95  BUN 15 11  CREATININE 1.08 1.12  CALCIUM 9.7 9.4    PT/INR:  Recent Labs  12/02/16 1459  LABPROT 14.0  INR 1.08   ABG    Component Value Date/Time   PHART 7.424 12/02/2016 1547   HCO3 28.4 (H) 12/02/2016 1547   O2SAT 95.9 12/02/2016 1547   CBG (last 3)   Recent Labs  12/04/16 0923 12/04/16 1340 12/04/16 1629  GLUCAP 206* 100* 103*    Assessment/Plan: S/P Procedure(s) (LRB): LEFT VIDEO ASSISTED THORACOSCOPY (Left) STAPLING OF APICAL BLEBS  LEFT LUNG (Left) MECHANCAL PLEURADESIS LEFT LUNG (Left)  1. CV-NSR in the 90s with good blood pressure.  2. Pulm-on room air with good oxygen saturation. CXR stable with no residual pneumothorax, await official read. Chest tube 200cc/24 hours, however only 20cc overnight.   3. Renal- creatinine 1.12. Electrolytes okay. Good urine output.  4. Blood glucose level with moderate control.  5. H and H stable 6. Pain control- PCA with oral narcotics.   Plan: Progressing well. Discontinue IV fluids. Possible removal of the chest tube today LOS: 7 days    Sharlene Doryessa N Conte 12/05/2016 DC chest tube today and home tomorrow

## 2016-12-06 ENCOUNTER — Inpatient Hospital Stay (HOSPITAL_COMMUNITY): Payer: BLUE CROSS/BLUE SHIELD

## 2016-12-06 LAB — COMPREHENSIVE METABOLIC PANEL
ALBUMIN: 4.3 g/dL (ref 3.5–5.0)
ALT: 15 U/L — ABNORMAL LOW (ref 17–63)
ANION GAP: 9 (ref 5–15)
AST: 18 U/L (ref 15–41)
Alkaline Phosphatase: 67 U/L (ref 38–126)
BUN: 12 mg/dL (ref 6–20)
CO2: 29 mmol/L (ref 22–32)
Calcium: 10.1 mg/dL (ref 8.9–10.3)
Chloride: 102 mmol/L (ref 101–111)
Creatinine, Ser: 1.14 mg/dL (ref 0.61–1.24)
GFR calc Af Amer: >60 mL/min (ref >60)
GFR calc non Af Amer: >60 mL/min (ref >60)
Glucose, Bld: 109 mg/dL — ABNORMAL HIGH (ref 65–99)
POTASSIUM: 3.8 mmol/L (ref 3.5–5.1)
SODIUM: 140 mmol/L (ref 135–145)
TOTAL PROTEIN: 7.9 g/dL (ref 6.5–8.1)
Total Bilirubin: 0.9 mg/dL (ref 0.3–1.2)

## 2016-12-06 LAB — PHOSPHORUS: PHOSPHORUS: 3.1 mg/dL (ref 2.5–4.6)

## 2016-12-06 LAB — CBC WITH DIFFERENTIAL/PLATELET
BASOS PCT: 0 %
Basophils Absolute: 0 10*3/uL (ref 0.0–0.1)
EOS ABS: 0.3 10*3/uL (ref 0.0–0.7)
EOS PCT: 3 %
HCT: 49.2 % (ref 39.0–52.0)
Hemoglobin: 16.6 g/dL (ref 13.0–17.0)
Lymphocytes Relative: 10 %
Lymphs Abs: 1.3 10*3/uL (ref 0.7–4.0)
MCH: 29.5 pg (ref 26.0–34.0)
MCHC: 33.7 g/dL (ref 30.0–36.0)
MCV: 87.4 fL (ref 78.0–100.0)
MONO ABS: 1.1 10*3/uL — AB (ref 0.1–1.0)
MONOS PCT: 9 %
Neutro Abs: 10.1 10*3/uL — ABNORMAL HIGH (ref 1.7–7.7)
Neutrophils Relative %: 78 %
Platelets: 309 10*3/uL (ref 150–400)
RBC: 5.63 MIL/uL (ref 4.22–5.81)
RDW: 12.2 % (ref 11.5–15.5)
WBC: 12.8 10*3/uL — ABNORMAL HIGH (ref 4.0–10.5)

## 2016-12-06 LAB — MAGNESIUM: Magnesium: 1.8 mg/dL (ref 1.7–2.4)

## 2016-12-06 MED ORDER — SENNOSIDES-DOCUSATE SODIUM 8.6-50 MG PO TABS: 1.0000 | ORAL_TABLET | Freq: Every evening | ORAL | 0 refills | Status: AC | PRN

## 2016-12-06 MED ORDER — PHENOL 1.4 % MT LIQD
1.0000 | OROMUCOSAL | Status: DC | PRN
  Filled 2016-12-06 (×2): qty 177

## 2016-12-06 MED ORDER — OXYCODONE HCL 5 MG PO TABS: 5.0000 mg | ORAL_TABLET | Freq: Four times a day (QID) | ORAL | 0 refills | Status: AC | PRN

## 2016-12-06 NOTE — Care Management Note (Signed)
Case Management Note  Patient Details  Name: Corey Wilson MRN: [id number] Date of Birth: 09/06/1981  Subjective/Objective: 36 y.o. M admitted with [facility]neumothorax [facility]. Pt disinterested in  Information provided by this CM. [employer] has website that allows its members to look at participating providers so they can choose who they would like to provide their health care and make their own appts. Pt states" I do not trust Drs and probably will not call and make any appt". Encouraged to do this prior to his [age] birthday as this when most health issues arise.                   Action/Plan: Anticipate discharge home today. No further CM needs but will be available should additional discharge needs arise.   Expected Discharge Date:  12/06/16               Expected Discharge Plan:  Home/Self Care  In-House Referral:     Discharge planning Services  CM Consult  Post Acute Care Choice:  NA Choice offered to:  Patient  DME Arranged:  N/A DME Agency:  NA  HH Arranged:  NA HH Agency:  NA  Status of Service:  Completed, signed off  If discussed at Long Length of Stay Meetings, dates discussed:    Additional Comments:  Yvone NeuCrutchfield, Angelisse Riso M, RN 12/06/2016, 3:08 PM

## 2016-12-06 NOTE — Progress Notes (Signed)
12/06/2016 1450 Discharge AVS meds taken today and those due this evening reviewed.  Follow-up appointments and when to call md reviewed.  D/C IV and TELE.  Questions and concerns addressed.   D/C home per orders. Kathryne HitchAllen, Adrina Armijo C

## 2016-12-06 NOTE — Progress Notes (Signed)
      [address]       [place] [id number]             [phone]      3 Days Post-Op Procedure(s) (LRB): LEFT VIDEO ASSISTED THORACOSCOPY (Left) STAPLING OF APICAL BLEBS LEFT LUNG (Left) MECHANCAL PLEURADESIS LEFT LUNG (Left) Subjective: No issues overnight.   Objective: Vital signs in last 24 hours: Temp:  [98.6 F (37 C)-99.2 F (37.3 C)] 98.6 F (37 C) (02/04 0523) Pulse Rate:  [102-114] 114 (02/04 0523) Cardiac Rhythm: Normal sinus rhythm (02/03 1947) Resp:  [16-19] 19 (02/04 0523) BP: (117-129)/(68-76) 122/68 (02/04 0523) SpO2:  [99 %-100 %] 100 % (02/04 0523)     Intake/Output from previous day: 02/03 0701 - 02/04 0700 In: 600 [P.O.:600] Out: 20 [Chest Tube:20] Intake/Output this shift: No intake/output data recorded.  General appearance: alert, cooperative and no distress Heart: irregularly irregular rhythm Lungs: clear to auscultation bilaterally Abdomen: soft, non-tender; bowel sounds normal; no masses,  no organomegaly Extremities: extremities normal, atraumatic, no cyanosis or edema Wound: clean and dry  Lab Results:  Recent Labs  12/05/16 0406 12/06/16 0146  WBC 10.0 12.8*  HGB 15.0 16.6  HCT 45.3 49.2  PLT 239 309   BMET:  Recent Labs  12/05/16 0406 12/06/16 0146  NA 140 140  K 4.5 3.8  CL 106 102  CO2 29 29  GLUCOSE 95 109*  BUN 11 12  CREATININE 1.12 1.14  CALCIUM 9.4 10.1    PT/INR: No results for input(s): LABPROT, INR in the last 72 hours. ABG    Component Value Date/Time   PHART 7.424 12/02/2016 1547   HCO3 28.4 (H) 12/02/2016 1547   O2SAT 95.9 12/02/2016 1547   CBG (last 3)   Recent Labs  12/04/16 0923 12/04/16 1340 12/04/16 1629  GLUCAP 206* 100* 103*    Assessment/Plan: S/P Procedure(s) (LRB): LEFT VIDEO ASSISTED THORACOSCOPY (Left) STAPLING OF APICAL BLEBS LEFT LUNG (Left) MECHANCAL PLEURADESIS LEFT LUNG (Left)  1. CV-NSR in the 80s with change of position got up to 120bpm. Some  irregularity on telemetry, would consider outpatient follow-up. Stable blood pressure.  2. Pulm-on room air with good oxygen saturation. CXR stable, small left apical pneumothorax appears stable with slight increase in subcutaneous air. 3. Renal- creatinine 1.14. Electrolytes okay. Good urine output.  4. Blood glucose level with moderate control.  5. H and H stable 6. Pain control- oral narcotics  Plan: Can likely discharge if okay with Dr. Donata ClayVan Trigt. He has a follow-up CXR and appointment on 12/24/2016 with Dr. Tyrone SageGerhardt. Suture removal next week.     LOS: 8 days    Sharlene Doryessa N Georgana Romain 12/06/2016

## 2016-12-06 NOTE — Discharge Summary (Signed)
Physician Discharge Summary  Corey Wilson:[id number] DOB: February 28, 1981 DOA: 11/28/2016  PCP: No primary care provider on file.  Admit date: 11/28/2016 Discharge date: 12/06/2016  Admitted From: Home Disposition:  Home  Recommendations for Outpatient Follow-up:  1. Establish with and Follow up with PCP in 1-2 weeks 2. Call Dr. Dennie Maizes office 2/5 to schedule suture removal appointment 3. Follow up with [facility] Dr. Tyrone Sage on 12/24/16 at 11:45 AM and come at 11:15 to have CXR done on first floor of building.  4. Please obtain BMP/CBC with in one week to monitor WBC Count  Home Health: No Equipment/Devices: None  Discharge Condition: Stable CODE STATUS: FULL CODE Diet recommendation: Regular Diet  Brief/Interim Summary: Patient is 36 year old male with previous history of pneumothorax on the right 2; 2 years ago he underwent right video-assisted thoracoscopy at [facility] hospital.Patient previously was a smoker but has not smoked for approximately 4 years no. 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 He presented to the [facility] emergency room 11/28/2016 with significant shortness of breath and was found to have a large left pneumothorax. Chest tube was placed and the patient was transferred to [place] for CVTS eval. Dr.Gerhardt followed  and patient taken for VATS with stapling of blebs on 12/03/2016 for Pneumothorax with Air Leak. Extubated and on Fentanyl PCA. On medical floor now and had D/C of Chest Tube 12/05/16. CXR this AM showed that patient still had a tiny left apical pneumothorax but per Cardiothoracic Surgery was ok to D/C. Patient counseled on smoking and is deemed stable to be D/C'd home and follow up with Cardiothoracic Surgery as an outpatient. Will need to establish with PCP post hospitalization.   Discharge Diagnoses:  Principal Problem:   Spontaneous pneumothorax Active Problems:   Constipation   Tobacco abuse, in remission   Hx of  seizure disorder   Shortness of breath  Spontaneous Left sided pneumothorax of Emphysematous Bleb with Air Leak s/p VATS, Stapling of Apical Blebs and Mechanical Pleurodesis of Left Lung POD 3 -s/p chest tube 1/27 at [facility] ER -Chest Tube removed on 12/05/16 -CXR 12/01/2016 showed Trace left apical pneumothorax, 5% or less. Left chest wall emphysema is decreased. Chest tube in place with slightly different orientation of the apex. Clear lungs. Normal heart size -CXR 12/04/16 showed stable left apical chest tube. Surgical sutures overlie the bilateral lung apices. Stable cardiomediastinal silhouette with normal heart size. No pneumothorax. No pleural effusion. Lungs appear clear, with no acute consolidative airspace disease and no pulmonary edema. -CXR on 12/05/16 showed Interval development of minimal left apical pneumothorax. Left-sided chest tube is unchanged in position. -CXR this AM showed The left-sided chest tube has been removed. The tiny left apical pneumothorax seen previously is very similar in size. It is stable to minimally increased. There is increased air in the left lateral chest wall. There is rounded density near the suture line in the left apex which is unchanged, either a calcified nodule or a tiny amount of blood products from recent surgery. This is of doubtful significance. The lungs are otherwise clear. No right-sided pneumothorax. The cardiomediastinal silhouette is normal. -c/w Analgesia with OTC Tylenol and Oxycodone IR 5 mg po q6hprn  -Bowel Regimen with Senna-Docusate 8.6-50 mg po qHS for Constipation  -s/p POD 3 VATS, Stapling of Apical Blebs and Mechanical Pleurodesis of Left Lung -Ok to D/C from Cardiothoracic Standpoint and patient to call Dr. Dennie Maizes office 12/07/16 to schedule suture removal appointment -Has Cardiothoracic Follow  up on 12/24/16 at 11:45 and appointment for CXR at 11:15 -Will need to Establish with PCP  Mild Leukocytosis -Likely Reactive to  Above -WBC went from 8.0 -> 12.2 -> 10.0 -> 12.8 -No Current S/Sx of Infection -Repeat CBC as an outpatient  Constipation, improved -Likely Opioid induced  -C/w Bowel Regimen with 1 tablet of Senna-Doucsate po qHSprn when taking Opioid for Pain control  Hx of Tobacco Abuse/Recreational Drug Use -Has not used Tobacco in 4 years per patient -Still smokes marijuana Occasionally; Counseling given -Patient was using a Vape in Room; Counseling given and told patient he was not allowed to vape in the hospital -Advised to stop smoking all together     Hx of Seizure -Stable. No Active Issues  Hyperglycemia -Improved -Likely stress related  -IV Fluids with D5W 1/2NS D/C'd -Had CBG Monitoring q4h and Novolog sq Scale while in the hospital -CBG's have been running 100-206 -Follow up with PCP as an outpatient  Discharge Instructions  Discharge Instructions    Call MD for:  difficulty breathing, headache or visual disturbances    Complete by:  As directed    Call MD for:  extreme fatigue    Complete by:  As directed    Call MD for:  persistant dizziness or light-headedness    Complete by:  As directed    Call MD for:  persistant nausea and vomiting    Complete by:  As directed    Call MD for:  severe uncontrolled pain    Complete by:  As directed    Call MD for:  temperature >100.4    Complete by:  As directed    Diet - low sodium heart healthy    Complete by:  As directed    Discharge instructions    Complete by:  As directed    Establish with a PCP for Follow Up. Call [facility] office to schedule appointment for Suture Removal and follow up with Dr. Tyrone SageGerhardt on 12/24/16 and have repeat CXR. Take all medications as prescribed. If symptoms change or worsen please return to the ED for evaluation.   Increase activity slowly    Complete by:  As directed      Allergies as of 12/06/2016      Reactions   No Known Allergies       Medication List    TAKE these medications   oxyCODONE 5  MG immediate release tablet Commonly known as:  Oxy IR/ROXICODONE Take 1-2 tablets (5-10 mg total) by mouth every 6 (six) hours as needed for severe pain.   senna-docusate 8.6-50 MG tablet Commonly known as:  Senokot-S Take 1 tablet by mouth at bedtime as needed for mild constipation.      Follow-up Information    Delight OvensEdward B Gerhardt, MD. Call in 1 day(s).   Specialty:  Cardiothoracic Surgery Why:  Your appointment is on 12/24/2016 at 11:45am. Please arrive at 11:15am [facility] which is located on the first floor of our building.  Contact information: [address] [place] [address] [phone]        nursing. Call in 1 day(s).   Contact information: Please call Dr. Dennie MaizesGerhardt's office on Monday 2/5 for a suture removal appointment.          Allergies  Allergen Reactions  . No Known Allergies    Consultations:  [facility] Cardiothoracic Surgery Dr. Cheryll CockayneGerhardt/Dr. Donata ClayVan Trigt   Procedures/Studies: Dg Chest 2 View  Result Date: 12/06/2016 CLINICAL  DATA:  Left-sided chest tube removal. EXAM: CHEST  2 VIEW COMPARISON:  Multiple chest x-rays since December 03, 2016 FINDINGS: The left-sided chest tube has been removed. The tiny left apical pneumothorax seen previously is very similar in size. It is stable to minimally increased. There is increased air in the left lateral chest wall. There is rounded density near the suture line in the left apex which is unchanged, either a calcified nodule or a tiny amount of blood products from recent surgery. This is of doubtful significance. The lungs are otherwise clear. No right-sided pneumothorax. The cardiomediastinal silhouette is normal. IMPRESSION: 1. The left chest tube has been removed. The tiny left apical pneumothorax is stable to minimally larger in the interval. Recommend continued attention on follow-up. 2. Increased air in the subcutaneous tissues of the left chest. Recommend attention on follow-up.  3. No other change. These results will be called to the ordering clinician or representative by the Radiologist Assistant, and communication documented in the PACS or zVision Dashboard. Electronically Signed   By: Gerome Sam III M.D   On: 12/06/2016 07:25   Dg Chest 2 View  Result Date: 11/30/2016 CLINICAL DATA:  Follow-up left-sided pneumothorax with chest tube treatment. EXAM: CHEST  2 VIEW COMPARISON:  Portable chest x-ray of November 29, 2016 FINDINGS: The left apical pneumothorax persists and amounts to 5% or less of the lung volume. The left-sided chest tube tip projects over the posterior interspace of the fourth and fifth left ribs. This is slightly lower than on the previous study. There is no mediastinal shift. There is no pleural effusion. The right lung is clear. The heart and pulmonary vascularity are normal. IMPRESSION: 5% or less left apical pneumothorax which appears stable. Slightly inferiorly positioned left chest tube today as compared to yesterday's study. Electronically Signed   By: David  Swaziland M.D.   On: 11/30/2016 07:12   Dg Chest Port 1 View  Result Date: 12/05/2016 CLINICAL DATA:  Status post left-sided fluoroscopy.  Pneumothorax. EXAM: PORTABLE CHEST 1 VIEW COMPARISON:  None. FINDINGS: The heart size and mediastinal contours are within normal limits. Right lung is clear. Left-sided chest tube is unchanged in position. Minimal left apical pneumothorax is noted. No consolidative process is noted. The visualized skeletal structures are unremarkable. IMPRESSION: Interval development of minimal left apical pneumothorax. Left-sided chest tube is unchanged in position. Electronically Signed   By: Lupita Raider, M.D.   On: 12/05/2016 08:00   Dg Chest Port 1 View  Result Date: 12/04/2016 CLINICAL DATA:  Chest tube, VATS, thoracotomy EXAM: PORTABLE CHEST 1 VIEW COMPARISON:  Chest radiograph from one day prior. FINDINGS: Stable left apical chest tube. Surgical sutures overlie the  bilateral lung apices. Stable cardiomediastinal silhouette with normal heart size. No pneumothorax. No pleural effusion. Lungs appear clear, with no acute consolidative airspace disease and no pulmonary edema. IMPRESSION: No residual pneumothorax.  No active disease. Electronically Signed   By: Delbert Phenix M.D.   On: 12/04/2016 07:43   Dg Chest Port 1 View  Result Date: 12/03/2016 CLINICAL DATA:  Post thoracotomy EXAM: PORTABLE CHEST 1 VIEW COMPARISON:  12/01/2016 FINDINGS: Cardiomediastinal silhouette is stable. Left chest tube with tip in left upper medial hemithorax. No infiltrate or pulmonary edema. Trace residual left apical pneumothorax with improvement from prior exam. IMPRESSION: Left chest tube with tip in left upper medial hemithorax. No infiltrate or pulmonary edema. Trace residual left apical pneumothorax with improvement from prior exam. Electronically Signed   By: Natasha Mead  M.D.   On: 12/03/2016 15:16   Dg Chest Port 1 View  Result Date: 12/01/2016 CLINICAL DATA:  Left-sided chest tube.  Pneumothorax. EXAM: PORTABLE CHEST 1 VIEW COMPARISON:  Yesterday FINDINGS: Trace left apical pneumothorax, 5% or less. Left chest wall emphysema is decreased. Chest tube in place with slightly different orientation of the apex. Clear lungs. Normal heart size. IMPRESSION: Stable trace left apical pneumothorax. Electronically Signed   By: Marnee Spring M.D.   On: 12/01/2016 08:42   Dg Chest Port 1 View  Result Date: 11/29/2016 CLINICAL DATA:  Pneumothorax.  Chest tube EXAM: PORTABLE CHEST 1 VIEW COMPARISON:  11/28/2016 FINDINGS: Left chest tube in satisfactory position. Minimal left apical pneumothorax. No effusion. Lungs are well aerated without infiltrate. Surgical staples in the right lung apex. IMPRESSION: Left chest tube in good position. Minimal left apical pneumothorax. No pleural effusion. Electronically Signed   By: Marlan Palau M.D.   On: 11/29/2016 08:37    Subjective: Seen and examined  this AM and was awoken from sleep. No CP or SOB and slept well. Ready to go home.   Discharge Exam: Vitals:   12/06/16 0523 12/06/16 1245  BP: 122/68 119/70  Pulse: (!) 114 95  Resp: 19 20  Temp: 98.6 F (37 C) 98.5 F (36.9 C)   Vitals:   12/05/16 1220 12/05/16 2007 12/06/16 0523 12/06/16 1245  BP: 129/76 117/73 122/68 119/70  Pulse: (!) 102 (!) 112 (!) 114 95  Resp: 19 19 19 20   Temp: 98.7 F (37.1 C) 99.2 F (37.3 C) 98.6 F (37 C) 98.5 F (36.9 C)  TempSrc: Oral Oral Oral Oral  SpO2: 100% 99% 100% 98%  Weight:      Height:       General: Pt is alert, awake, not in acute distress Cardiovascular: RRR, S1/S2 +, no rubs, no gallops Respiratory: CTAB bilaterally, no wheezing, no rhonchi; Not tachypenic or using any accessory muscles to breathe.  Abdominal: Soft, NT, ND, bowel sounds + Extremities: no edema, no cyanosis  The results of significant diagnostics from this hospitalization (including imaging, microbiology, ancillary and laboratory) are listed below for reference.    Microbiology: Recent Results (from the past 240 hour(s))  MRSA PCR Screening     Status: None   Collection Time: 12/03/16  9:58 PM  Result Value Ref Range Status   MRSA by PCR NEGATIVE NEGATIVE Final    Comment:        The GeneXpert MRSA Assay (FDA approved for NASAL specimens only), is one component of a comprehensive MRSA colonization surveillance program. It is not intended to diagnose MRSA infection nor to guide or monitor treatment for MRSA infections.     Labs: BNP (last 3 results) No results for input(s): BNP in the last 8760 hours. Basic Metabolic Panel:  Recent Labs Lab 12/02/16 1459 12/03/16 0222 12/04/16 0351 12/05/16 0406 12/06/16 0146  NA 141 141 138 140 140  K 4.9 5.0 4.8 4.5 3.8  CL 101 103 102 106 102  CO2 31 29 26 29 29   GLUCOSE 101* 102* 128* 95 109*  BUN 14 16 15 11 12   CREATININE 1.12 1.11 1.08 1.12 1.14  CALCIUM 10.2 9.9 9.7 9.4 10.1  MG  --  2.2  --   1.9 1.8  PHOS  --  4.1  --  4.2 3.1   Liver Function Tests:  Recent Labs Lab 12/02/16 1421 12/02/16 1459 12/03/16 0222 12/05/16 0406 12/06/16 0146  AST 15 15 18 17  18  ALT 15* 14* 16* 14* 15*  ALKPHOS 66 63 69 55 67  BILITOT 0.8 0.7 0.7 0.9 0.9  PROT 7.2 7.5 7.8 6.7 7.9  ALBUMIN 3.9 4.2 4.4 3.7 4.3   No results for input(s): LIPASE, AMYLASE in the last 168 hours. No results for input(s): AMMONIA in the last 168 hours. CBC:  Recent Labs Lab 12/02/16 0842 12/02/16 1421 12/02/16 1459 12/03/16 0222 12/04/16 0351 12/05/16 0406 12/06/16 0146  WBC 7.9 9.2 8.8 8.0 12.2* 10.0 12.8*  NEUTROABS 4.9 6.7  --  4.8  --   --  10.1*  HGB 16.1 16.5 16.8 17.1* 15.3 15.0 16.6  HCT 48.0 48.2 48.8 49.2 45.0 45.3 49.2  MCV 86.6 85.9 86.2 86.0 85.7 87.5 87.4  PLT 214 241 242 239 280 239 309   Cardiac Enzymes: No results for input(s): CKTOTAL, CKMB, CKMBINDEX, TROPONINI in the last 168 hours. BNP: Invalid input(s): POCBNP CBG:  Recent Labs Lab 12/04/16 0002 12/04/16 0402 12/04/16 0923 12/04/16 1340 12/04/16 1629  GLUCAP 134* 99 206* 100* 103*   D-Dimer No results for input(s): DDIMER in the last 72 hours. Hgb A1c No results for input(s): HGBA1C in the last 72 hours. Lipid Profile No results for input(s): CHOL, HDL, LDLCALC, TRIG, CHOLHDL, LDLDIRECT in the last 72 hours. Thyroid function studies No results for input(s): TSH, T4TOTAL, T3FREE, THYROIDAB in the last 72 hours.  Invalid input(s): FREET3 Anemia work up No results for input(s): VITAMINB12, FOLATE, FERRITIN, TIBC, IRON, RETICCTPCT in the last 72 hours. Urinalysis    Component Value Date/Time   COLORURINE STRAW (A) 12/02/2016 0050   APPEARANCEUR CLEAR 12/02/2016 0050   LABSPEC 1.010 12/02/2016 0050   PHURINE 6.0 12/02/2016 0050   GLUCOSEU NEGATIVE 12/02/2016 0050   HGBUR SMALL (A) 12/02/2016 0050   BILIRUBINUR NEGATIVE 12/02/2016 0050   KETONESUR NEGATIVE 12/02/2016 0050   PROTEINUR NEGATIVE 12/02/2016 0050    NITRITE NEGATIVE 12/02/2016 0050   LEUKOCYTESUR NEGATIVE 12/02/2016 0050   Sepsis Labs Invalid input(s): PROCALCITONIN,  WBC,  LACTICIDVEN Microbiology Recent Results (from the past 240 hour(s))  MRSA PCR Screening     Status: None   Collection Time: 12/03/16  9:58 PM  Result Value Ref Range Status   MRSA by PCR NEGATIVE NEGATIVE Final    Comment:        The GeneXpert MRSA Assay (FDA approved for NASAL specimens only), is one component of a comprehensive MRSA colonization surveillance program. It is not intended to diagnose MRSA infection nor to guide or monitor treatment for MRSA infections.    Time coordinating discharge: Over 30 minutes  SIGNED:  Merlene Laughter, DO [facility] 12/06/2016, 2:06 PM Pager [phone]  If 7PM-7AM, please contact night-coverage [email or web address] Password [email or web address]

## 2016-12-08 ENCOUNTER — Telehealth: Payer: Self-pay | Admitting: Surgical

## 2016-12-08 NOTE — Telephone Encounter (Signed)
      [address]       [place] [id number]             [phone]          [address]       [place] [id number]             [phone]    Yehuda SavannahChauncy Krontz [id number]   DATE OF PROCEDURE:  12/03/2016 DATE OF DISCHARGE:                              OPERATIVE REPORT   PREOPERATIVE DIAGNOSIS:  Spontaneous left pneumothorax with apical blebs.  POSTOPERATIVE DIAGNOSIS:  Spontaneous left pneumothorax with apical blebs.  SURGICAL PROCEDURE:  Left video-assisted thoracoscopy with stapling of apical blebs and mechanical pleurodesis.  SURGEON:  Sheliah PlaneEdward Gerhardt, M.D.  FIRST ASSISTANT:  Boris Lownessa Conti, PA.  Discharged home on 12/06/2016  Medications: Allergies as of 12/08/2016      Reactions   No Known Allergies       Medication List       Accurate as of 12/08/16  2:21 PM. Always use your most recent med list.          oxyCODONE 5 MG immediate release tablet Commonly known as:  Oxy IR/ROXICODONE Take 1-2 tablets (5-10 mg total) by mouth every 6 (six) hours as needed for severe pain.   senna-docusate 8.6-50 MG tablet Commonly known as:  Senokot-S Take 1 tablet by mouth at bedtime as needed for mild constipation.       Coumadin: N/A  Problems/Concerns: some soreness but good relief with pain meds. 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 Advised him that it is not our typical protocol, but told him to do no heavy lifting or pulling arm above the head.   Assessment:  Patient is doing well.   Instructions  provided.  Contact office if concerns or problems develop  Follow up Appointment: scheduled

## 2016-12-23 ENCOUNTER — Other Ambulatory Visit: Payer: Self-pay | Admitting: Cardiothoracic Surgery

## 2016-12-23 DIAGNOSIS — J9383 Other pneumothorax: Secondary | ICD-10-CM

## 2016-12-24 ENCOUNTER — Ambulatory Visit: Payer: Self-pay | Admitting: Cardiothoracic Surgery

## 2017-09-12 IMAGING — CR DG CHEST 1V PORT
1 series · 1 of 1 positions shown · non-contrast
Comparison: 12/01/2016

CLINICAL DATA: Post thoracotomy

EXAM:
PORTABLE CHEST 1 VIEW

[AP]
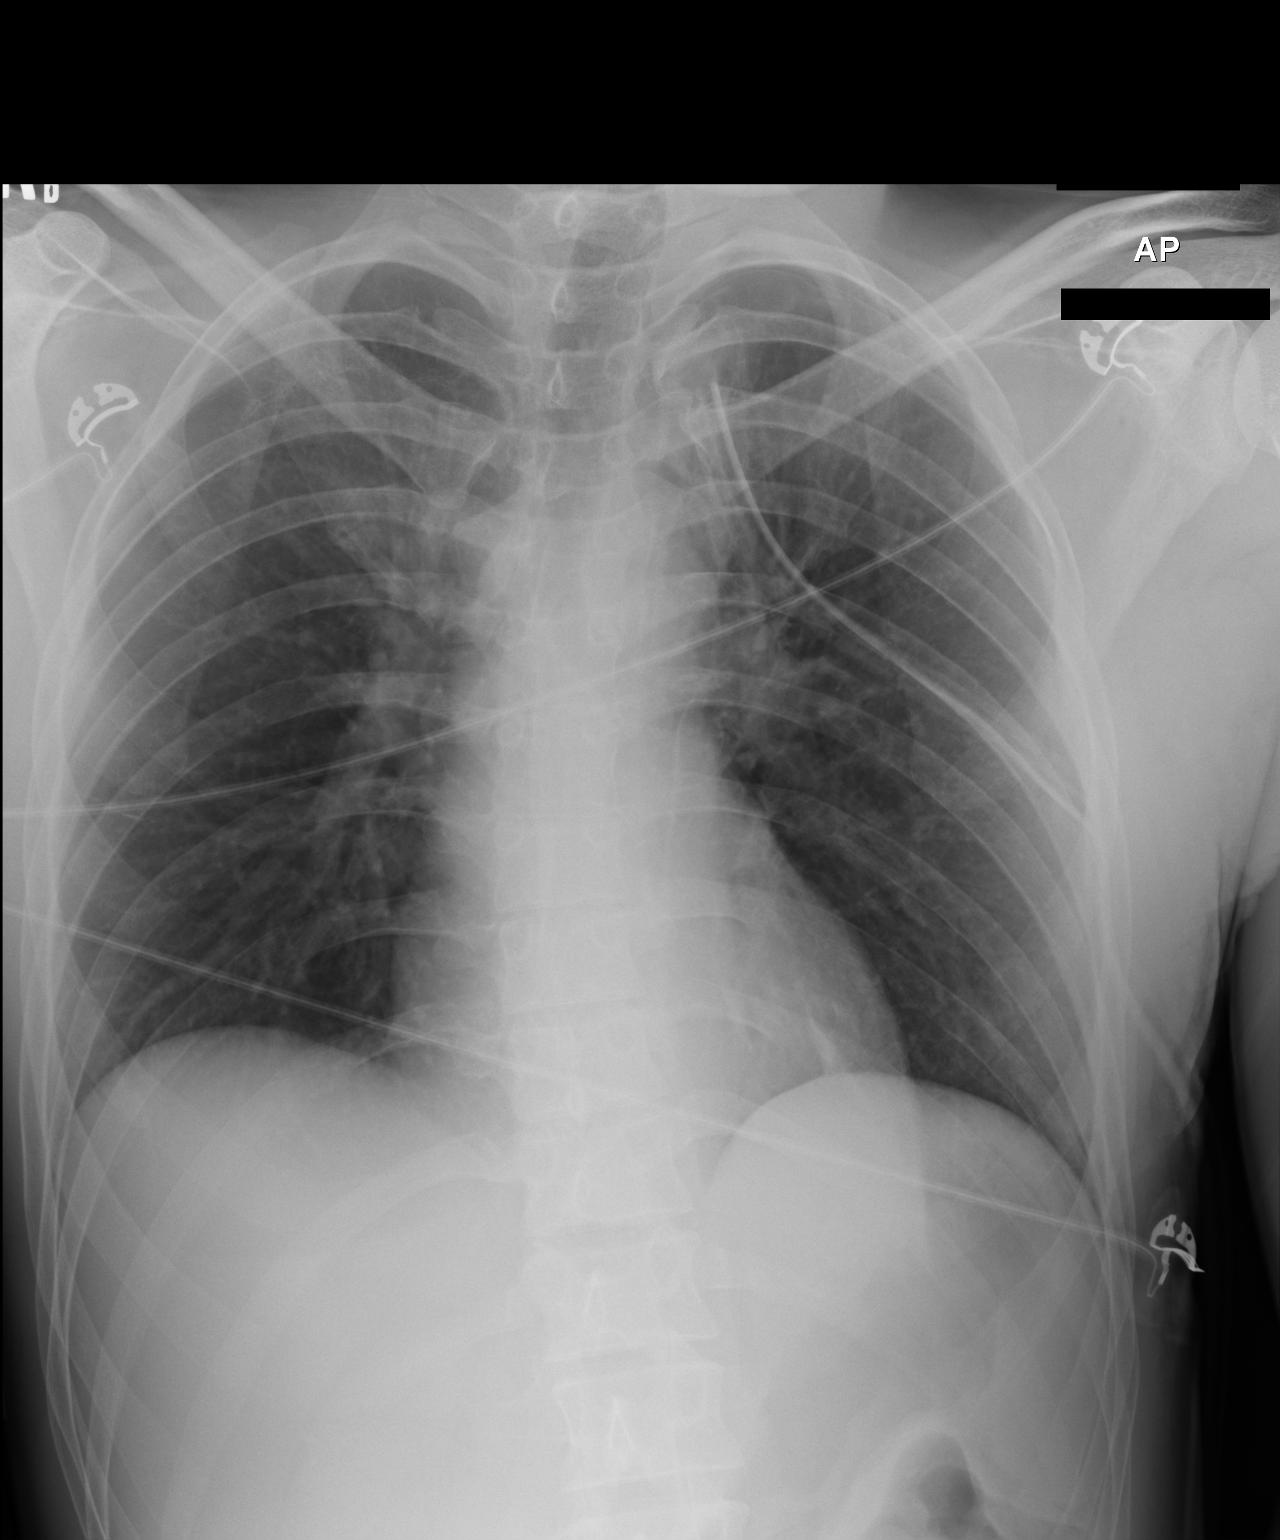

[1 of 1 positions shown; findings below may reference images not displayed]

FINDINGS: Cardiomediastinal silhouette is stable. Left chest tube with tip in
left upper medial hemithorax. No infiltrate or pulmonary edema.
Trace residual left apical pneumothorax with improvement from prior
exam.
IMPRESSION: Left chest tube with tip in left upper medial hemithorax. No
infiltrate or pulmonary edema. Trace residual left apical
pneumothorax with improvement from prior exam.

## 2024-03-19 ENCOUNTER — Emergency Department (HOSPITAL_BASED_OUTPATIENT_CLINIC_OR_DEPARTMENT_OTHER): Admitting: Radiology

## 2024-03-19 ENCOUNTER — Emergency Department (HOSPITAL_BASED_OUTPATIENT_CLINIC_OR_DEPARTMENT_OTHER)
Admission: EM | Admit: 2024-03-19 | Discharge: 2024-03-19 | Disposition: A | Discharge status: Home / Self Care | Attending: Emergency Medicine | Admitting: Emergency Medicine

## 2024-03-19 ENCOUNTER — Other Ambulatory Visit: Payer: Self-pay

## 2024-03-19 ENCOUNTER — Emergency Department (HOSPITAL_BASED_OUTPATIENT_CLINIC_OR_DEPARTMENT_OTHER)

## 2024-03-19 DIAGNOSIS — S8012XA Contusion of left lower leg, initial encounter: Secondary | ICD-10-CM | POA: Diagnosis not present

## 2024-03-19 DIAGNOSIS — S30810A Abrasion of lower back and pelvis, initial encounter: Secondary | ICD-10-CM | POA: Diagnosis not present

## 2024-03-19 DIAGNOSIS — Z87891 Personal history of nicotine dependence: Secondary | ICD-10-CM | POA: Insufficient documentation

## 2024-03-19 DIAGNOSIS — Y92410 Unspecified street and highway as the place of occurrence of the external cause: Secondary | ICD-10-CM | POA: Diagnosis not present

## 2024-03-19 DIAGNOSIS — S7012XA Contusion of left thigh, initial encounter: Secondary | ICD-10-CM | POA: Diagnosis not present

## 2024-03-19 DIAGNOSIS — S70212A Abrasion, left hip, initial encounter: Secondary | ICD-10-CM | POA: Insufficient documentation

## 2024-03-19 DIAGNOSIS — S80212A Abrasion, left knee, initial encounter: Secondary | ICD-10-CM | POA: Insufficient documentation

## 2024-03-19 DIAGNOSIS — S79922A Unspecified injury of left thigh, initial encounter: Secondary | ICD-10-CM | POA: Diagnosis present

## 2024-03-19 MED ORDER — CELECOXIB 200 MG PO CAPS: 200.0000 mg | ORAL_CAPSULE | Freq: Two times a day (BID) | ORAL | 0 refills | Status: DC | PRN

## 2024-03-19 MED ORDER — OXYCODONE-ACETAMINOPHEN 5-325 MG PO TABS
1.0000 | ORAL_TABLET | Freq: Once | ORAL | Status: AC
  Administered 2024-03-19: 1 via ORAL
  Filled 2024-03-19: qty 1

## 2024-03-19 MED ORDER — BACITRACIN ZINC 500 UNIT/GM EX OINT: 1.0000 | TOPICAL_OINTMENT | Freq: Two times a day (BID) | CUTANEOUS | 0 refills | Status: DC

## 2024-03-19 MED ORDER — BACITRACIN ZINC 500 UNIT/GM EX OINT
TOPICAL_OINTMENT | Freq: Once | CUTANEOUS | Status: AC
  Administered 2024-03-19: 31.5 via TOPICAL
  Filled 2024-03-19: qty 28.35

## 2024-03-19 MED ORDER — CYCLOBENZAPRINE HCL 10 MG PO TABS: 10.0000 mg | ORAL_TABLET | Freq: Two times a day (BID) | ORAL | 0 refills | Status: DC | PRN

## 2024-03-19 NOTE — Discharge Instructions (Addendum)
 As discussed, your workup today was overall reassuring.  X-rays of your pelvis, left femur, left knee, left lower leg, left elbow did not show obvious fracture or dislocation.  CT scan of your left knee appeared normal.  Given how much pain you are experiencing in your left knee, we will place you in a knee immobilizer and give you crutches.  Recommend follow-up with orthopedics in the outpatient setting for reassessment.  Will also send in medicine for pain.  Recommend continue applying antibiotic ointment over the areas of road rash as well as covering.  Wash area gently with warm soapy water and perform daily bandage changes.  Please do not hesitate to return if the worrisome signs and symptoms we discussed become apparent.
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
## 2024-03-19 NOTE — ED Notes (Signed)
 Will insert peripheral IV if pain is unable to be controlled with PO medications per Adelbert Adler PA
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
## 2024-03-22 ENCOUNTER — Telehealth (HOSPITAL_BASED_OUTPATIENT_CLINIC_OR_DEPARTMENT_OTHER): Payer: Self-pay | Admitting: Emergency Medicine

## 2024-03-22 MED ORDER — CYCLOBENZAPRINE HCL 10 MG PO TABS: 10.0000 mg | ORAL_TABLET | Freq: Two times a day (BID) | ORAL | 0 refills | Status: AC | PRN

## 2024-03-22 MED ORDER — CELECOXIB 200 MG PO CAPS: 200.0000 mg | ORAL_CAPSULE | Freq: Two times a day (BID) | ORAL | 0 refills | Status: AC | PRN

## 2024-03-22 MED ORDER — BACITRACIN ZINC 500 UNIT/GM EX OINT: 1.0000 | TOPICAL_OINTMENT | Freq: Two times a day (BID) | CUTANEOUS | 0 refills | Status: AC

## 2024-03-22 NOTE — Telephone Encounter (Cosign Needed)
 Patient called asking to have the bacitracin , Flexeril , Celebrex  sent to a different pharmacy.  The pharmacy was updated and prescriptions were sent.
